# Patient Record
Sex: Female | Born: 1970 | Race: Black or African American | Hispanic: Refuse to answer | Marital: Married | Smoking: Never smoker
Health system: Southern US, Community
[De-identification: ages and names within clinical notes are randomized; demographics above are authoritative.]

## PROBLEM LIST (undated history)

## (undated) DIAGNOSIS — T7840XA Allergy, unspecified, initial encounter: Secondary | ICD-10-CM

## (undated) DIAGNOSIS — E559 Vitamin D deficiency, unspecified: Secondary | ICD-10-CM

## (undated) HISTORY — PX: BREAST REDUCTION SURGERY: SHX8

## (undated) HISTORY — DX: Vitamin D deficiency, unspecified: E55.9

## (undated) HISTORY — PX: WISDOM TOOTH EXTRACTION: SHX21

## (undated) HISTORY — DX: Allergy, unspecified, initial encounter: T78.40XA

## (undated) HISTORY — PX: REDUCTION MAMMAPLASTY: SUR839

---

## 1997-12-22 ENCOUNTER — Ambulatory Visit (HOSPITAL_COMMUNITY): Admission: RE | Admit: 1997-12-22 | Discharge: 1997-12-23 | Payer: Self-pay | Admitting: Specialist

## 2001-06-13 ENCOUNTER — Other Ambulatory Visit: Admission: RE | Admit: 2001-06-13 | Discharge: 2001-06-13 | Payer: Self-pay | Admitting: Gynecology

## 2001-08-06 ENCOUNTER — Emergency Department (HOSPITAL_COMMUNITY): Admission: EM | Admit: 2001-08-06 | Discharge: 2001-08-06 | Payer: Self-pay | Admitting: Emergency Medicine

## 2002-02-28 ENCOUNTER — Encounter: Payer: Self-pay | Admitting: Endocrinology

## 2002-02-28 ENCOUNTER — Encounter: Admission: RE | Admit: 2002-02-28 | Discharge: 2002-02-28 | Payer: Self-pay | Admitting: Endocrinology

## 2002-06-22 ENCOUNTER — Other Ambulatory Visit: Admission: RE | Admit: 2002-06-22 | Discharge: 2002-06-22 | Payer: Self-pay | Admitting: Gynecology

## 2002-09-26 ENCOUNTER — Encounter: Admission: RE | Admit: 2002-09-26 | Discharge: 2002-09-26 | Payer: Self-pay | Admitting: Endocrinology

## 2002-09-26 ENCOUNTER — Encounter: Payer: Self-pay | Admitting: Endocrinology

## 2003-07-03 ENCOUNTER — Other Ambulatory Visit: Admission: RE | Admit: 2003-07-03 | Discharge: 2003-07-03 | Payer: Self-pay | Admitting: Gynecology

## 2004-01-15 ENCOUNTER — Encounter: Admission: RE | Admit: 2004-01-15 | Discharge: 2004-01-15 | Payer: Self-pay | Admitting: Gynecology

## 2004-06-04 ENCOUNTER — Other Ambulatory Visit: Admission: RE | Admit: 2004-06-04 | Discharge: 2004-06-04 | Payer: Self-pay | Admitting: Gynecology

## 2005-05-07 ENCOUNTER — Other Ambulatory Visit: Admission: RE | Admit: 2005-05-07 | Discharge: 2005-05-07 | Payer: Self-pay | Admitting: Gynecology

## 2005-10-23 ENCOUNTER — Encounter: Admission: RE | Admit: 2005-10-23 | Discharge: 2005-10-23 | Payer: Self-pay | Admitting: Endocrinology

## 2006-07-20 ENCOUNTER — Other Ambulatory Visit: Admission: RE | Admit: 2006-07-20 | Discharge: 2006-07-20 | Payer: Self-pay | Admitting: Gynecology

## 2007-06-04 LAB — HM PAP SMEAR

## 2007-07-19 ENCOUNTER — Other Ambulatory Visit: Admission: RE | Admit: 2007-07-19 | Discharge: 2007-07-19 | Payer: Self-pay | Admitting: Gynecology

## 2009-07-17 ENCOUNTER — Ambulatory Visit: Payer: Self-pay | Admitting: Vascular Surgery

## 2010-01-22 ENCOUNTER — Ambulatory Visit: Payer: Self-pay | Admitting: Internal Medicine

## 2010-05-27 ENCOUNTER — Encounter (INDEPENDENT_AMBULATORY_CARE_PROVIDER_SITE_OTHER): Payer: Self-pay | Admitting: *Deleted

## 2010-05-27 LAB — CONVERTED CEMR LAB
ALT: 26 units/L (ref 0–35)
AST: 26 units/L (ref 0–37)
Albumin: 4 g/dL (ref 3.5–5.2)
Alkaline Phosphatase: 52 units/L (ref 39–117)
BUN: 9 mg/dL (ref 6–23)
Basophils Absolute: 0 10*3/uL (ref 0.0–0.1)
Basophils Relative: 0 % (ref 0–1)
CO2: 23 meq/L (ref 19–32)
Calcium: 9.2 mg/dL (ref 8.4–10.5)
Chlamydia, DNA Probe: NEGATIVE
Chloride: 105 meq/L (ref 96–112)
Cholesterol: 153 mg/dL (ref 0–200)
Creatinine, Ser: 0.84 mg/dL (ref 0.40–1.20)
Eosinophils Absolute: 0 10*3/uL (ref 0.0–0.7)
Eosinophils Relative: 0 % (ref 0–5)
GC Probe Amp, Genital: NEGATIVE
Glucose, Bld: 84 mg/dL (ref 70–99)
HCT: 38.7 % (ref 36.0–46.0)
HDL: 70 mg/dL (ref >39)
Hemoglobin: 12.5 g/dL (ref 12.0–15.0)
LDL Cholesterol: 67 mg/dL (ref 0–99)
Lymphocytes Relative: 39 % (ref 12–46)
Lymphs Abs: 1.8 10*3/uL (ref 0.7–4.0)
MCHC: 32.3 g/dL (ref 30.0–36.0)
MCV: 90.2 fL (ref 78.0–100.0)
Monocytes Absolute: 0.2 10*3/uL (ref 0.1–1.0)
Monocytes Relative: 4 % (ref 3–12)
Neutro Abs: 2.6 10*3/uL (ref 1.7–7.7)
Neutrophils Relative %: 56 % (ref 43–77)
Platelets: 372 10*3/uL (ref 150–400)
Potassium: 4.1 meq/L (ref 3.5–5.3)
RBC: 4.29 M/uL (ref 3.87–5.11)
RDW: 15.2 % (ref 11.5–15.5)
Sodium: 140 meq/L (ref 135–145)
TSH: 0.681 microintl units/mL (ref 0.350–4.500)
Total Bilirubin: 0.6 mg/dL (ref 0.3–1.2)
Total CHOL/HDL Ratio: 2.2
Total Protein: 7.8 g/dL (ref 6.0–8.3)
Triglycerides: 81 mg/dL (ref <150)
VLDL: 16 mg/dL (ref 0–40)
WBC: 4.6 10*3/uL (ref 4.0–10.5)

## 2010-12-16 NOTE — Procedures (Signed)
DUPLEX DEEP VENOUS EXAM - LOWER EXTREMITY   INDICATION:  Right calf pain.   HISTORY:  Edema:  Zero.  Trauma/Surgery:  Right foot surgery.  Pain:  Yes.  PE:  No.  Previous DVT:  No.  Anticoagulants:  No.  Other:  No.   DUPLEX EXAM:                CFV   SFV   PopV  PTV    GSV                R  L  R  L  R  L  R   L  R  L  Thrombosis    o  o  o     o     o      o  Spontaneous   +  +  +     +     +      +  Phasic        +  +  +     +     +      +  Augmentation  +  +  +     +     +      +  Compressible  +  +  +     +     +      +  Competent     +  +  +     +     +      +   Legend:  + - yes  o - no  p - partial  D - decreased   IMPRESSION:  There does not appear to be any deep venous thrombus noted  in the right leg.    _____________________________  Larina Earthly, M.D.   CB/MEDQ  D:  07/19/2009  T:  07/19/2009  Job:  [id number]

## 2011-08-04 NOTE — L&D Delivery Note (Signed)
Delivery Note At 6:45 PM a viable female was delivered via SVD (LOP ).  APGAR: 8, 9; weight .   Placenta status: complete and intact, .  Cord: 3vc  with the following complications: none .    Anesthesia:  none Episiotomy: none Lacerations: small labial laceration, hemostatic. No repair Suture Repair: none Est. Blood Loss (mL):   Mom to postpartum.  Baby to nursery-stable.  Tawnya Crook 06/08/2012, 7:04 PM

## 2011-11-09 ENCOUNTER — Other Ambulatory Visit: Payer: Self-pay

## 2011-11-09 LAB — OB RESULTS CONSOLE HGB/HCT, BLOOD: HCT: 33 %

## 2011-11-09 LAB — OB RESULTS CONSOLE RPR: RPR: NONREACTIVE

## 2011-11-09 LAB — OB RESULTS CONSOLE ABO/RH: RH Type: POSITIVE

## 2011-11-09 LAB — OB RESULTS CONSOLE HEPATITIS B SURFACE ANTIGEN: Hepatitis B Surface Ag: NEGATIVE

## 2011-11-09 LAB — OB RESULTS CONSOLE HIV ANTIBODY (ROUTINE TESTING): HIV: NONREACTIVE

## 2011-12-05 ENCOUNTER — Other Ambulatory Visit: Payer: Self-pay

## 2011-12-22 ENCOUNTER — Other Ambulatory Visit (HOSPITAL_COMMUNITY): Payer: Self-pay | Admitting: Obstetrics and Gynecology

## 2011-12-22 ENCOUNTER — Encounter (HOSPITAL_COMMUNITY): Payer: Self-pay | Admitting: Obstetrics and Gynecology

## 2011-12-22 DIAGNOSIS — Z3689 Encounter for other specified antenatal screening: Secondary | ICD-10-CM

## 2011-12-22 DIAGNOSIS — O289 Unspecified abnormal findings on antenatal screening of mother: Secondary | ICD-10-CM

## 2011-12-22 DIAGNOSIS — O09529 Supervision of elderly multigravida, unspecified trimester: Secondary | ICD-10-CM

## 2012-01-05 ENCOUNTER — Ambulatory Visit (HOSPITAL_COMMUNITY)
Admission: RE | Admit: 2012-01-05 | Discharge: 2012-01-05 | Disposition: A | Discharge status: Home / Self Care | Payer: BC Managed Care – PPO | Source: Ambulatory Visit | Attending: Obstetrics and Gynecology | Admitting: Obstetrics and Gynecology

## 2012-01-05 ENCOUNTER — Other Ambulatory Visit (HOSPITAL_COMMUNITY): Payer: Self-pay

## 2012-01-05 ENCOUNTER — Encounter (HOSPITAL_COMMUNITY): Payer: Self-pay

## 2012-01-05 VITALS — BP 131/79 | HR 85 | Wt 225.0 lb

## 2012-01-05 DIAGNOSIS — O289 Unspecified abnormal findings on antenatal screening of mother: Secondary | ICD-10-CM | POA: Insufficient documentation

## 2012-01-05 DIAGNOSIS — O09529 Supervision of elderly multigravida, unspecified trimester: Secondary | ICD-10-CM

## 2012-01-05 DIAGNOSIS — O358XX0 Maternal care for other (suspected) fetal abnormality and damage, not applicable or unspecified: Secondary | ICD-10-CM | POA: Insufficient documentation

## 2012-01-05 DIAGNOSIS — O09519 Supervision of elderly primigravida, unspecified trimester: Secondary | ICD-10-CM | POA: Insufficient documentation

## 2012-01-05 DIAGNOSIS — Z1389 Encounter for screening for other disorder: Secondary | ICD-10-CM | POA: Insufficient documentation

## 2012-01-05 DIAGNOSIS — Z3689 Encounter for other specified antenatal screening: Secondary | ICD-10-CM

## 2012-01-05 DIAGNOSIS — Z363 Encounter for antenatal screening for malformations: Secondary | ICD-10-CM | POA: Insufficient documentation

## 2012-01-05 NOTE — Progress Notes (Signed)
Patient seen today  for ultrasound appointment.  See full report in [facility].  Alpha Gula, MD  Patient seen today due to advanced maternal age and 1:61 Down syndrome risk by first trimester screen.  Ultrasound revealed 2 soft markers for Down syndrome (thickened nuchal fold, bilateral renal pylectasis). Reviewed risk of Down syndrome based on the presence of 2 soft-markers for aneuploidy.  After counseling, the patient elected to undergo cell free fetal DNA testing.  Single IUP at 17 weeks 3 days Thickened nuchal fold Bilateral mild renal pylectasis is appreciated (4 mm) The remainder of the fetal anatomy appears normal; however, limited views of the fetal heart were obtained due to fetal positioning and early gestational age Normal amniotic fluid volume  Recommend follow up ultrasound in 4 weeks to reevaluate the fetal heart.  If [facility] test (cell free fetal DNA) returns positive for Trisomy 21, would recommend formal fetal echo with Peds cardiology.

## 2012-01-07 ENCOUNTER — Encounter (HOSPITAL_COMMUNITY): Payer: Self-pay

## 2012-01-07 NOTE — Progress Notes (Signed)
Genetic Counseling  High-Risk Gestation Note  Appointment Date:  01/05/2012 Referred By: Shelby Pila, MD Date of Birth:  [birth year]    Pregnancy History: G1P0 Estimated Date of Delivery: 06/11/12 Estimated Gestational Age: [redacted]w[redacted]d Attending: Alpha Gula, MD   [name] and her husband, [name], were seen for genetic counseling regarding a maternal age of 41 y.o. and an screen positive risk for Down syndrome based on First Trimester Screening.  They were counseled regarding maternal age and the association with risk for chromosome conditions due to nondisjunction with aging of the ova.   We reviewed chromosomes, nondisjunction, and the associated 1 in 27 risk for fetal aneuploidy related to a maternal age of 41 y.o. at [redacted]w[redacted]d gestation.  They were counseled that the risk for aneuploidy decreases as gestational age increases, accounting for those pregnancies which spontaneously abort.  We specifically discussed Down syndrome (trisomy 38), trisomies 16 and 60, and sex chromosome aneuploidies (47,XXX and 47,XXY) including the common features and prognoses of each.   We also reviewed [name]'s First trimester screen result and the associated decrease in risk for fetal Down syndrome (1 in 54 to 1 in 14). However, since 1 in 52 is still above the screen's cutoff, this is considered a screen positive result. They were counseled regarding other explanations for a screen positive result including normal variation and differences in maternal metabolism.  In addition, we reviewed the screen adjusted reduction in risk for trisomy 18 (1 in 105 to less than 1 in 10,000).  They understand that First trimester screening provides a pregnancy specific risk for these conditions, but is not considered to be diagnostic.    We reviewed other available screening options including noninvasive prenatal testing (NIPT) and detailed ultrasound.  Specifically, we discussed that NIPT  analyzes cell free fetal DNA found in the maternal circulation. This test is not diagnostic for chromosome conditions, but can provide information regarding the presence or absence of extra fetal DNA for chromosomes 13, 18, and 21. Thus, it would not identify or rule out all genetic conditions. The reported detection rate is greater than 99% for Trisomy 21, greater than 98% for Trisomy 18, and is approximately 80% (8 out of 10) for Trisomy 13. The false positive rate is reported to be less than 0.1% for any of these conditions.  In addition, we discussed that ~50-80% of fetuses with Down syndrome and up to 90-95% of fetuses with trisomy 18/13, when well visualized, have detectable anomalies or soft markers by detailed ultrasound. This couple was also counseled regarding diagnostic testing via amniocentesis.  We reviewed the approximate 1 in 300-500 risk for complications, including spontaneous pregnancy loss.   A complete ultrasound was performed today.  Ultrasound performed today visualized a thickened nuchal fold measurement and bilateral renal pyelectasis.  Limited views of fetal heart were obtained due to fetal position and gestational age. The ultrasound report will be sent under separate cover. A follow-up ultrasound was planned in 4 weeks. The nuchal fold refers to the skin on the back of the fetal neck. A thick nuchal fold measurement is typically defined as greater than 5 mm at 15-[redacted] weeks gestation and greater than 6 mm at 18 through [redacted] weeks gestation. Approximately 1-2% of normal pregnancies will have a thick nuchal fold; therefore, most babies with this finding are born normal and healthy.  However, the presence of a thick nuchal fold increases the chance for Down syndrome in a pregnancy.  We discussed that fetal  pyelectasis is defined as the dilatation of the fetal renal pelvis/pelvises due to excess urine. This finding is estimated to occur in 2-3% of fetuses.  The female to female ratio is 2:1.   Typically, babies with mild pyelectasis are born normal and healthy and we are usually unable to determine why this extra fluid is present.  This urine accumulation may regress, stay the same or continue to accumulate.  The more fluid that accumulates, the more likely this fluid could be the result of a compromise in kidney function, an obstruction, or narrowing of the ureters which transport urine out of the body, thus causing backflow of fluid into the kidneys.  Therefore, it is important to follow pyelectasis to make sure it does not become more concerning.  Also, in some cases postnatal evaluation of baby's kidneys may be warranted.  We discussed that the finding of pyelectasis is associated with an increased risk for fetal aneuploidy.  This risk is highest when other anomalies or fetal differences are visualized.   We discussed that the presence of both a thickened nuchal fold and pyelectasis would increase the chance for Down syndrome above the patient's First trimester screen risk of 1 in 61 to approximately 16%. After consideration of all the additional screening and testing options, and a clear understanding of the newness and limitations of NIPT, they elected to proceed with cell free fetal DNA testing ([facility]) at the time of today's visit, but declined amniocentesis. Those results will be available in 8-10 days. We will contact the patient and forward results to her OB office once results are available.   [name] was provided with written information regarding sickle cell anemia (SCA) including the carrier frequency and incidence in the African-American population, the availability of carrier testing and prenatal diagnosis if indicated.  In addition, we discussed that hemoglobinopathies are routinely screened for as part of the [facility] newborn screening panel.  She previously had hemoglobin electrophoresis performed through her OB, which indicated the presence of normal adult hemoglobin (Hgb AA).     Both family histories were reviewed and found to be contributory for polycystic kidney disease. The patient reported that her mother has a history of polycystic kidney disease and had a kidney transplant in her 60's. Additionally, the patient's maternal uncle and maternal grandmother have a history of polycystic kidney disease. [name] reported that she had a normal kidney evaluation in her 34's.  She reported that none of her siblings have symptoms or a diagnosis of polycystic kidney disease.   Polycystic kidney disease (PKD) is one of the most common genetic disorders in adulthood with an incidence of about 1 in 1000. It is a systemic disease that is associated with renal system failure, liver disease, cerebral aneurysm, portal hypertension, dissection of the aorta and other cardiac abnormalities. There are two forms of PKD: autosomal dominant PKD (ADPKD) and autosomal recessive PKD (ARPKD).  ADPKD is generally a late-onset disorder characterized by progressive cyst development and bilaterally enlarged polycystic kidneys. These cysts can slowly replace much of the mass of the kidneys, reducing kidney function and leading to kidney failure. The condition is clinically variable, even within families.  Rarely does this condition present prenatally or in infancy.  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 We reviewed autosomal dominant inheritance. A person who has a dominant genetic condition tends to show symptoms of the disease, and each of their pregnancies has a 1 in 2 (50%)  chance to inherit the condition.  About 85% of cases result from an altered gene located on chromosome 16 (PKD1).  The remaining 15% of cases are thought to result from an altered gene on chromosome 4 (PKD2).  There may also be other genes involved in some cases.  Genetic testing is available and is most informative by testing a symptomatic family member first. It is  important to realize that genetic testing is not useful in predicting age of onset, severity, or rate of disease progression in individuals. The diagnosis of autosomal dominant polycystic kidney disease is established primarily by renal imaging studies and genetic testing can be used to confirm or establish an uncertain diagnosis.  Diagnostic criteria has been established for renal sonography and it is thought that almost all (nearly 100%) of patients with the more common type of autosomal dominant PKD (PKD1) will meet this criteria by the age of 30 years or older.  At risk family members may consider renal imaging studies for their own diagnostic purposes. Since [name] reported that her renal evaluation was within normal limits, the chance that she is a carrier of the disease mutation is greatly reduced.  If she does not carry the gene for the condition, it cannot be passed on to her offspring. It may be helpful for the couple make their pediatrician aware of this family history.    The father of the pregnancy reported a kidney transplant in 2009 due to focal segmental glomerulosclerosis (FSGS). He reported that the FSGS was suspected to be secondary to a viral infection. In the case of an environmental cause, this reported family history would not be expected to increase recurrence risk for relatives.  Without further information regarding the provided family history, an accurate genetic risk cannot be calculated. Further genetic counseling is warranted if more information is obtained.   [name] denied exposure to environmental toxins or chemical agents. She denied the use of alcohol, tobacco or street drugs. She denied significant viral illnesses during the course of her pregnancy. Her medical and surgical histories were noncontributory.     I counseled this couple regarding the above risks and available options.  The approximate face-to-face time with the genetic counselor was 45  minutes.    Quinn Plowman, MS,  Certified Genetic Counselor 01/07/2012

## 2012-01-15 ENCOUNTER — Telehealth (HOSPITAL_COMMUNITY): Payer: Self-pay | Admitting: MS"

## 2012-01-15 NOTE — Telephone Encounter (Signed)
Left message for patient to return call.   Shelby Duran 01/15/2012 2:33 PM

## 2012-01-15 NOTE — Telephone Encounter (Signed)
Called Shelby Duran to discuss her [facility], cell free fetal DNA testing.  We reviewed that these are within normal limits, showing a less than 1 in 10,000 risk for trisomies 21, 18 and 13.  We reviewed that this testing identifies > 99% of pregnancies with trisomy 21, >97% of pregnancies with trisomy 38, and >80% with trisomy 23; the false positive rate is <0.1% for all conditions.  She understands that this testing does not identify all genetic conditions.  All questions were answered to her satisfaction, she was encouraged to call with additional questions or concerns.  Quinn Plowman, MS Patent attorney

## 2012-02-02 ENCOUNTER — Ambulatory Visit (HOSPITAL_COMMUNITY): Payer: BC Managed Care – PPO

## 2012-02-25 DIAGNOSIS — IMO0002 Reserved for concepts with insufficient information to code with codable children: Secondary | ICD-10-CM

## 2012-02-25 DIAGNOSIS — Z3400 Encounter for supervision of normal first pregnancy, unspecified trimester: Secondary | ICD-10-CM

## 2012-03-09 ENCOUNTER — Ambulatory Visit (INDEPENDENT_AMBULATORY_CARE_PROVIDER_SITE_OTHER): Payer: BC Managed Care – PPO | Admitting: Advanced Practice Midwife

## 2012-03-09 ENCOUNTER — Encounter: Payer: Self-pay | Admitting: Advanced Practice Midwife

## 2012-03-09 VITALS — BP 117/82 | Temp 98.1°F | Ht 67.5 in | Wt 226.1 lb

## 2012-03-09 DIAGNOSIS — Z531 Procedure and treatment not carried out because of patient's decision for reasons of belief and group pressure: Secondary | ICD-10-CM | POA: Insufficient documentation

## 2012-03-09 DIAGNOSIS — O09529 Supervision of elderly multigravida, unspecified trimester: Secondary | ICD-10-CM

## 2012-03-09 DIAGNOSIS — Z3400 Encounter for supervision of normal first pregnancy, unspecified trimester: Secondary | ICD-10-CM

## 2012-03-09 DIAGNOSIS — IMO0002 Reserved for concepts with insufficient information to code with codable children: Secondary | ICD-10-CM

## 2012-03-09 LAB — CBC
HCT: 33.2 % — ABNORMAL LOW (ref 36.0–46.0)
Hemoglobin: 10.8 g/dL — ABNORMAL LOW (ref 12.0–15.0)
MCV: 87.8 fL (ref 78.0–100.0)
RBC: 3.78 MIL/uL — ABNORMAL LOW (ref 3.87–5.11)
WBC: 5.5 10*3/uL (ref 4.0–10.5)

## 2012-03-09 LAB — POCT URINALYSIS DIP (DEVICE)
Bilirubin Urine: NEGATIVE
Hgb urine dipstick: NEGATIVE
Ketones, ur: NEGATIVE mg/dL
Leukocytes, UA: NEGATIVE
Protein, ur: NEGATIVE mg/dL
Specific Gravity, Urine: >=1.03 (ref 1.005–1.030)
pH: 6 (ref 5.0–8.0)

## 2012-03-09 NOTE — Patient Instructions (Addendum)
Pregnancy - Third Trimester The third trimester of pregnancy (the last 3 months) is a period of the most rapid growth for you and your baby. The baby approaches a length of 20 inches and a weight of 6 to 10 pounds. The baby is adding on fat and getting ready for life outside your body. While inside, babies have periods of sleeping and waking, suck their thumbs, and hiccups. You can often feel small contractions of the uterus. This is false labor. It is also called Braxton-Hicks contractions. This is like a practice for labor. The usual problems in this stage of pregnancy include more difficulty breathing, swelling of the hands and feet from water retention, and having to urinate more often because of the uterus and baby pressing on your bladder.  PRENATAL EXAMS  Blood work may continue to be done during prenatal exams. These tests are done to check on your health and the probable health of your baby. Blood work is used to follow your blood levels (hemoglobin). Anemia (low hemoglobin) is common during pregnancy. Iron and vitamins are given to help prevent this. You may also continue to be checked for diabetes. Some of the past blood tests may be done again.   The size of the uterus is measured during each visit. This makes sure your baby is growing properly according to your pregnancy dates.   Your blood pressure is checked every prenatal visit. This is to make sure you are not getting toxemia.   Your urine is checked every prenatal visit for infection, diabetes and protein.   Your weight is checked at each visit. This is done to make sure gains are happening at the suggested rate and that you and your baby are growing normally.   Sometimes, an ultrasound is performed to confirm the position and the proper growth and development of the baby. This is a test done that bounces harmless sound waves off the baby so your caregiver can more accurately determine due dates.   Discuss the type of pain  medication and anesthesia you will have during your labor and delivery.   Discuss the possibility and anesthesia if a Cesarean Section might be necessary.   Inform your caregiver if there is any mental or physical violence at home.  Sometimes, a specialized non-stress test, contraction stress test and biophysical profile are done to make sure the baby is not having a problem. Checking the amniotic fluid surrounding the baby is called an amniocentesis. The amniotic fluid is removed by sticking a needle into the belly (abdomen). This is sometimes done near the end of pregnancy if an early delivery is required. In this case, it is done to help make sure the baby's lungs are mature enough for the baby to live outside of the womb. If the lungs are not mature and it is unsafe to deliver the baby, an injection of cortisone medication is given to the mother 1 to 2 days before the delivery. This helps the baby's lungs mature and makes it safer to deliver the baby. CHANGES OCCURING IN THE THIRD TRIMESTER OF PREGNANCY Your body goes through many changes during pregnancy. They vary from person to person. Talk to your caregiver about changes you notice and are concerned about.  During the last trimester, you have probably had an increase in your appetite. It is normal to have cravings for certain foods. This varies from person to person and pregnancy to pregnancy.   You may begin to get stretch marks on your hips,   abdomen, and breasts. These are normal changes in the body during pregnancy. There are no exercises or medications to take which prevent this change.   Constipation may be treated with a stool softener or adding bulk to your diet. Drinking lots of fluids, fiber in vegetables, fruits, and whole grains are helpful.   Exercising is also helpful. If you have been very active up until your pregnancy, most of these activities can be continued during your pregnancy. If you have been less active, it is helpful  to start an exercise program such as walking. Consult your caregiver before starting exercise programs.   Avoid all smoking, alcohol, un-prescribed drugs, herbs and "street drugs" during your pregnancy. These chemicals affect the formation and growth of the baby. Avoid chemicals throughout the pregnancy to ensure the delivery of a healthy infant.   Backache, varicose veins and hemorrhoids may develop or get worse.   You will tire more easily in the third trimester, which is normal.   The baby's movements may be stronger and more often.   You may become short of breath easily.   Your belly button may stick out.   A yellow discharge may leak from your breasts called colostrum.   You may have a bloody mucus discharge. This usually occurs a few days to a week before labor begins.  HOME CARE INSTRUCTIONS   Keep your caregiver's appointments. Follow your caregiver's instructions regarding medication use, exercise, and diet.   During pregnancy, you are providing food for you and your baby. Continue to eat regular, well-balanced meals. Choose foods such as meat, fish, milk and other low fat dairy products, vegetables, fruits, and whole-grain breads and cereals. Your caregiver will tell you of the ideal weight gain.   A physical sexual relationship may be continued throughout pregnancy if there are no other problems such as early (premature) leaking of amniotic fluid from the membranes, vaginal bleeding, or belly (abdominal) pain.   Exercise regularly if there are no restrictions. Check with your caregiver if you are unsure of the safety of your exercises. Greater weight gain will occur in the last 2 trimesters of pregnancy. Exercising helps:   Control your weight.   Get you in shape for labor and delivery.   You lose weight after you deliver.   Rest a lot with legs elevated, or as needed for leg cramps or low back pain.   Wear a good support or jogging bra for breast tenderness during  pregnancy. This may help if worn during sleep. Pads or tissues may be used in the bra if you are leaking colostrum.   Do not use hot tubs, steam rooms, or saunas.   Wear your seat belt when driving. This protects you and your baby if you are in an accident.   Avoid raw meat, cat litter boxes and soil used by cats. These carry germs that can cause birth defects in the baby.   It is easier to loose urine during pregnancy. Tightening up and strengthening the pelvic muscles will help with this problem. You can practice stopping your urination while you are going to the bathroom. These are the same muscles you need to strengthen. It is also the muscles you would use if you were trying to stop from passing gas. You can practice tightening these muscles up 10 times a set and repeating this about 3 times per day. Once you know what muscles to tighten up, do not perform these exercises during urination. It is more likely   to cause an infection by backing up the urine.   Ask for help if you have financial, counseling or nutritional needs during pregnancy. Your caregiver will be able to offer counseling for these needs as well as refer you for other special needs.   Make a list of emergency phone numbers and have them available.   Plan on getting help from family or friends when you go home from the hospital.   Make a trial run to the hospital.   Take prenatal classes with the father to understand, practice and ask questions about the labor and delivery.   Prepare the baby's room/nursery.   Do not travel out of the city unless it is absolutely necessary and with the advice of your caregiver.   Wear only low or no heal shoes to have better balance and prevent falling.  MEDICATIONS AND DRUG USE IN PREGNANCY  Take prenatal vitamins as directed. The vitamin should contain 1 milligram of folic acid. Keep all vitamins out of reach of children. Only a couple vitamins or tablets containing iron may be fatal  to a baby or young child when ingested.   Avoid use of all medications, including herbs, over-the-counter medications, not prescribed or suggested by your caregiver. Only take over-the-counter or prescription medicines for pain, discomfort, or fever as directed by your caregiver. Do not use aspirin, ibuprofen (Motrin, Advil, Nuprin) or naproxen (Aleve) unless OK'd by your caregiver.   Let your caregiver also know about herbs you may be using.   Alcohol is related to a number of birth defects. This includes fetal alcohol syndrome. All alcohol, in any form, should be avoided completely. Smoking will cause low birth rate and premature babies.   Street/illegal drugs are very harmful to the baby. They are absolutely forbidden. A baby born to an addicted mother will be addicted at birth. The baby will go through the same withdrawal an adult does.  SEEK MEDICAL CARE IF: You have any concerns or worries during your pregnancy. It is better to call with your questions if you feel they cannot wait, rather than worry about them. DECISIONS ABOUT CIRCUMCISION You may or may not know the sex of your baby. If you know your baby is a boy, it may be time to think about circumcision. Circumcision is the removal of the foreskin of the penis. This is the skin that covers the sensitive end of the penis. There is no proven medical need for this. Often this decision is made on what is popular at the time or based upon religious beliefs and social issues. You can discuss these issues with your caregiver or pediatrician. SEEK IMMEDIATE MEDICAL CARE IF:   An unexplained oral temperature above 102 F (38.9 C) develops, or as your caregiver suggests.   You have leaking of fluid from the vagina (birth canal). If leaking membranes are suspected, take your temperature and tell your caregiver of this when you call.   There is vaginal spotting, bleeding or passing clots. Tell your caregiver of the amount and how many pads are  used.   You develop a bad smelling vaginal discharge with a change in the color from clear to white.   You develop vomiting that lasts more than 24 hours.   You develop chills or fever.   You develop shortness of breath.   You develop burning on urination.   You loose more than 2 pounds of weight or gain more than 2 pounds of weight or as suggested by your   caregiver.   You notice sudden swelling of your face, hands, and feet or legs.   You develop belly (abdominal) pain. Round ligament discomfort is a common non-cancerous (benign) cause of abdominal pain in pregnancy. Your caregiver still must evaluate you.   You develop a severe headache that does not go away.   You develop visual problems, blurred or double vision.   If you have not felt your baby move for more than 1 hour. If you think the baby is not moving as much as usual, eat something with sugar in it and lie down on your left side for an hour. The baby should move at least 4 to 5 times per hour. Call right away if your baby moves less than that.   You fall, are in a car accident or any kind of trauma.   There is mental or physical violence at home.  Document Released: 07/14/2001 Document Revised: 07/09/2011 Document Reviewed: 01/16/2009 [employer] Patient Information 2012 [employer]. 

## 2012-03-09 NOTE — Progress Notes (Signed)
Pulse 90 Needs 1 hr gtt @ 953

## 2012-03-09 NOTE — Progress Notes (Signed)
New OB Transfer due to [facility] Witness status of blood product refusal.  Had [facility] with Dr Ellyn Hack. Reviewed chart.  Primary problems are AMA, fetal pyelectasis (mild), and abnormal genetic screen with [facility].  Discussed implications of blood product refusal (will copy her power of attorney info) in light of low risk of hemorrhage. She is aware that emergencies could happen which would present a potentially lethal situation if blood products refused and what would result from that refusal. She and husband are willing to accept risks. However, she currently does not have a high risk of hemorrhage.   Discussed low wt gain (she states she has only gained one pound). Will check growth on her followup US in early Sept at which time we will also check fetal kidneys. There is a family history of kidney problems. Wants to meet "all the doctors".  Explained how our service works. She would prefer "no learners". Also explained how we use residents. Will explore with Dr Penne Lash viability of having her be a CNM patient. She does want an unmedicated birth if possible, declines water birth.

## 2012-03-10 LAB — RPR

## 2012-03-23 ENCOUNTER — Ambulatory Visit (INDEPENDENT_AMBULATORY_CARE_PROVIDER_SITE_OTHER): Payer: BC Managed Care – PPO | Admitting: Physician Assistant

## 2012-03-23 VITALS — BP 120/78 | Temp 98.5°F | Wt 224.8 lb

## 2012-03-23 DIAGNOSIS — Z3400 Encounter for supervision of normal first pregnancy, unspecified trimester: Secondary | ICD-10-CM

## 2012-03-23 DIAGNOSIS — Z23 Encounter for immunization: Secondary | ICD-10-CM

## 2012-03-23 DIAGNOSIS — O09529 Supervision of elderly multigravida, unspecified trimester: Secondary | ICD-10-CM

## 2012-03-23 LAB — POCT URINALYSIS DIP (DEVICE)
Bilirubin Urine: NEGATIVE
Hgb urine dipstick: NEGATIVE
Ketones, ur: NEGATIVE mg/dL
Leukocytes, UA: NEGATIVE
Protein, ur: NEGATIVE mg/dL
Specific Gravity, Urine: 1.025 (ref 1.005–1.030)
pH: 6 (ref 5.0–8.0)

## 2012-03-23 MED ORDER — TETANUS-DIPHTH-ACELL PERTUSSIS 5-2.5-18.5 LF-MCG/0.5 IM SUSP
0.5000 mL | Freq: Once | INTRAMUSCULAR | Status: AC
  Administered 2012-03-23: 0.5 mL via INTRAMUSCULAR

## 2012-03-23 NOTE — Progress Notes (Signed)
Pt doing well overall.  Denies contractions, bleeding, discharge, dysuria, LBP.  Is having increased fetal movement (>10 per 2 hours).  1 hr GTT was 105.

## 2012-03-23 NOTE — Progress Notes (Signed)
I have seen/examine this patient and agree with the above. Literature given on Epo and Cell saver for pt to review and discuss at next visit. Will re-chk Hgb at 32 weeks.

## 2012-03-23 NOTE — Progress Notes (Signed)
Pulse 86 Patient desires tdap today

## 2012-03-23 NOTE — Patient Instructions (Addendum)
Fetal Movement Counts Patient Name: __________________________________________________ Patient Due Date: ____________________ Shelby Duran counts is highly recommended in high risk pregnancies, but it is a good idea for every pregnant woman to do. Start counting fetal movements at 28 weeks of the pregnancy. Fetal movements increase after eating a full meal or eating or drinking something sweet (the blood sugar is higher). It is also important to drink plenty of fluids (well hydrated) before doing the count. Lie on your left side because it helps with the circulation or you can sit in a comfortable chair with your arms over your belly (abdomen) with no distractions around you. DOING THE COUNT  Try to do the count the same time of day each time you do it.   Mark the day and time, then see how long it takes for you to feel 10 movements (kicks, flutters, swishes, rolls). You should have at least 10 movements within 2 hours. You will most likely feel 10 movements in much less than 2 hours. If you do not, wait an hour and count again. After a couple of days you will see a pattern.   What you are looking for is a change in the pattern or not enough counts in 2 hours. Is it taking longer in time to reach 10 movements?  SEEK MEDICAL CARE IF:  You feel less than 10 counts in 2 hours. Tried twice.   No movement in one hour.   The pattern is changing or taking longer each day to reach 10 counts in 2 hours.   You feel the baby is not moving as it usually does.  Date: ____________ Movements: ____________ Start time: ____________ Shelby Duran time: ____________  Date: ____________ Movements: ____________ Start time: ____________ Shelby Duran time: ____________ Date: ____________ Movements: ____________ Start time: ____________ Shelby Duran time: ____________ Date: ____________ Movements: ____________ Start time: ____________ Shelby Duran time: ____________ Date: ____________ Movements: ____________ Start time: ____________ Shelby Duran time:  ____________  Epoetin Alfa injection What is this medicine? EPOETIN ALFA (e POE e tin AL fa) helps your body make more red blood cells. This medicine is used to treat anemia caused by chronic kidney failure, cancer chemotherapy, or HIV-therapy. It may also be used before surgery if you have anemia. This medicine may be used for other purposes; ask your health care provider or pharmacist if you have questions. What should I tell my health care provider before I take this medicine? They need to know if you have any of these conditions: -blood clotting disorders -cancer patient not on chemotherapy -cystic fibrosis -heart disease, such as angina or heart failure -hemoglobin level of 12 g/dL or greater -high blood pressure -low levels of folate, iron, or vitamin B12 -seizures -an unusual or allergic reaction to erythropoietin, albumin, benzyl alcohol, hamster proteins, other medicines, foods, dyes, or preservatives -pregnant or trying to get pregnant -breast-feeding  Intraoperative and postoperative blood salvage   All topics are updated as new evidence becomes available and our peer review process is complete.  Literature review current through: Jul 2013.  This topic last updated: Aug 05, 2011.  INTRODUCTION - After preoperative autologous donation and intraoperative hemodilution, intraoperative and postoperative blood salvage are the third and fourth components of a complete blood conservation program. (See "Preoperative autologous blood donation" and "Acute normovolemic intraoperative hemodilution".) In salvage techniques, blood that is shed during or after surgery (or trauma) is retrieved, processed in some fashion, and returned to the patient. Processing can be as simple as filtration or, most commonly, involves centrifugation and washing prior  to re-transfusion. INTRAOPERATIVE BLOOD SALVAGE - Intraoperative blood salvage (IBS) is also known as intraoperative autologous transfusion,  intraoperative salvage, or intraoperative autotransfusion. IBS is unique among autologous transfusion methods because of its capacity to provide immense quantities of autologous blood very rapidly. In comparison, preoperative collection is limited by time constraints and patient tolerance, hemodilution by blood volume and hemodynamic considerations, and postoperative salvage by mechanical problems and concern about microbial contamination. IBS can be utilized throughout a surgical procedure and can replace blood in proportion to the volume lost. In certain situations, most notably liver transplantation, the rate and volume of replacement may be extraordinary [1]. Technology - The modern era of IBS technology began in the mid-1970s when the two currently available salvage systems - a centrifuge-based cell salvage instrument and a passive canister collection system (used with or without supplementary washing) - were introduced. The first commercially available instrument that could concentrate and wash salvaged red cells was the Cell Saver ([employer], [place], [place]), introduced in 1974. Cell Saver technology has become so widely accepted (through five generations of Cell Savers) that virtually all similar IBS instruments are referred to as "cell savers". The procedure starts with the surgeon aspirating blood from the surgical field through a suction wand attached to dual-channel tubing; this allows anticoagulant and blood to be mixed as the blood is aspirated. The aspirated blood is collected in a reservoir (usually a cardiotomy reservoir), until there is sufficient blood for processing. At this point, the salvaged blood is pumped into the centrifuge bowl, where it is concentrated and then washed with an isotonic electrolyte solution, most often saline. The processed red cell suspension is then pumped from the centrifuge bowl into an infusion bag. Modern cell salvage instruments can process a full reservoir of  blood, and provide 225 mL of washed, saline-suspended red cells with a hematocrit of 50 percent or more in approximately three minutes. Thus, a massively bleeding patient can be provided with the equivalent of 12 units of banked blood per hour. Canister collection systems began with one marketed by the [employer] [facility] [place], [place]), now marketed as Receptal ([employer], [employer], [place]). These systems share with the centrifuge systems the dual-channel suction tubing with which shed blood is aspirated and anticoagulated. Salvaged blood is collected by vacuum into a disposable liner bag contained in a reusable, rigid plastic canister. Blood that is collected in the liner bag can be concentrated and washed with standard blood bank washing instruments or it can be transfused directly (through a filter). Canister-based systems are much less expensive to purchase and to operate, as cell saver-type instruments retail for over $30,000 and require trained personnel to operate them. They are, however, unable to provide processed (ie, washed) blood for reinfusion fast enough to be effective in massively bleeding patients, and there remains substantial question about the quality and possibly the safety of unprocessed blood harvested from certain surgical procedures (see below) [2]. Indications - IBS is widely applied in a variety of high blood use surgical procedures, including cardiac [3,4], vascular [5], orthopedic [6], urologic [7,8], trauma [9,10], gynecologic [11] and transplantation surgery [1]. It has also been effective in neurosurgery and plastic surgery, when blood use was anticipated to be high. It has three other advantages: IBS, when used as the principal form of blood replacement, avoids the substantial (up to 50 percent) wastage of blood collected and not transfused via preoperative autologous donation.  IBS, in contradistinction to preoperative autologous donation, is considered  acceptable by  some Jehovah's witnesses, provided the blood does not leave the operating room for processing [12]. (See "The approach to the patient who refuses blood transfusion".)  IBS may be valuable for the patient with serologic problems for whom crossmatch-compatible blood is unobtainable. Contraindications - There are several contraindications to the use of IBS. Some are absolute, such as settings in which there is a potential for the simultaneous collection during salvage of microfibrillar collagen hemostats used to promote local hemostasis (eg, Avitene, [employer], [place], [place] hydrogen peroxide, or betadine. Other contraindications are relative, as with a patient with a known blood-borne infectious disease, which could endanger health care workers should the IBS apparatus malfunction [13]. Some previously absolute contraindications are undergoing continuous reevaluation, such as patients with malignancies and/or bacterial contamination of operative sites. Malignant cells have been seen in shed blood collected intraoperatively, and extensive cell washing does not render contaminated blood sterile. As a result, both of these settings have long been considered absolute contraindications to IBS. However, the pioneering use of IBS in cancer surgery [7,14,15] and evidence that the benefit of IBS in hemorrhaging trauma patients with enterically contaminated wounds outweighed the potential spread of the infection [16] lend support to those who feel that additional studies might lead to more liberal use of IBS in these conditions. Product quality - Salvaged, washed red cells differ little from traditional washed red cells. The quality of the red cells, as measured by posttransfusion survival, is excellent. The transfusate is devoid of functional platelets and white cells, and is markedly reduced in coagulation factors and other serum proteins. Unprocessed (unwashed) salvaged blood differs substantially  from the washed product: hemoglobin levels are lower (range: 7.0 to 9.0 g/dL); plasma hemoglobin levels are often strikingly high; platelets are present, although their function may be impaired; anticoagulant is present in measurable amounts; and, most important, thromboplastic substances and other coagulation system components that might lead to coagulation abnormalities are present [2]. The latter characteristic has led to an unwavering belief, not entirely substantiated by clinical data, that unprocessed salvaged blood is inherently inferior to (or potentially more dangerous than) processed blood. The author's own view, after reviewing the literature, is that unprocessed blood taken from a systemically anticoagulated patient does not carry a substantially increased risk of inducing a coagulopathy. However, unprocessed blood taken from the surgical abdomen of a nonanticoagulated patient appears to increase the potential for inducing a coagulopathy when infused, and its use should be avoided, when possible. Complications - There are a number of potential and generally avoidable complications of IBS: Air embolism, which can be prevented by draining the reinfusion bag into a separate transfer pack prior to transfusion or, with newer instruments, by in-line air detectors.  A coagulopathy, which can be avoided by washing the salvaged blood.  The "salvaged blood syndrome" refers to the development of disseminated intravascular coagulation (DIC) and/or increased capillary permeability in the lungs (acute respiratory distress syndrome) or periphery (anasarca) after the administration of washed autologous red cells [17]. This syndrome appears to be mediated by activation of platelets and white cells during salvage. Platelet debris may be responsible for DIC whereas activated white cells may increase vascular permeability. This rare syndrome can be prevented by avoiding the aspiration of very dilute blood and by using  citrate, rather than heparin, as the anticoagulant.  Infection, which can be avoided by using prophylactic antibiotics and by not aspirating from obviously infected sites.  Fat embolism, which is preventable by extra washing and by using a microaggregate  filter for reinfusion.  Microaggregates, consisting of white cell and platelet debris, can develop in salvaged blood. Microembolization can be prevented by using a microaggregate filter for reinfusion. Standards - Hospitals with IBS programs or blood center providers of the service, should establish written policies and procedures that are regularly reviewed by a physician with responsibility for the program [18]. There must be written policy for the proper collection, labeling and storage of blood collected by IBS. Collection equipment and technique must ensure that the blood is aseptic. Periodic quality control testing of recovered blood is recommended. If collected under aseptic conditions with a device that washes with normal saline and if properly labeled, blood collected by IBS may be stored either at room temperature for up to six hours, or at 1 to 6 C for up to 24 hours, provided that cold storage is begun within six hours of initiating the collection. Proper labeling should include at a minimum the name and identifying number of the patient, the component name, the statement "For Autologous Use Only", and the expiration date and time. Blood collected by IBS should never be transfused to any patient other than the one from whom it was collected. Overview - IBS is an important method of blood conservation that complements other methods. It assumes increasing importance in the patient who is unable to donate autologous blood prior to surgery but who is anticipated to bleed substantially intraoperatively. Given the high cost of current technology, it becomes cost effective (when compared with the cost of providing allogeneic blood) when at least two or  more units of blood can be salvaged and reinfused. It is likely that the future will bring enhancements to the current technology, resulting in even greater cost effectiveness, thereby enhancing interest in IBS for a wider variety of surgical procedures. Additional studies in oncologic surgery, in particular, might pave the way for this technique to become a standard of practice in this vast group of patients who are not currently offered this autologous option. POSTOPERATIVE BLOOD SALVAGE - Postoperative blood salvage (PBS), or postoperative blood collection, refers to the collection of blood from surgical drains and reinfusion with or, much more commonly, without processing. PBS is an accepted practice in cardiac surgery [19], in which its safety and efficacy have been confirmed in most but not all studies [20,21]. It has also become increasing popular in orthopedic procedures [22]. The contribution of PBS to overall blood conservation is generally less than that of preoperative blood donation or IBS. Furthermore, some have questioned the ability of PBS to reduce allogeneic transfusion, particularly in orthopedic surgery [23].

## 2012-04-06 ENCOUNTER — Ambulatory Visit (HOSPITAL_COMMUNITY)
Admission: RE | Admit: 2012-04-06 | Discharge: 2012-04-06 | Disposition: A | Discharge status: Home / Self Care | Payer: BC Managed Care – PPO | Source: Ambulatory Visit | Attending: Advanced Practice Midwife | Admitting: Advanced Practice Midwife

## 2012-04-06 ENCOUNTER — Ambulatory Visit (INDEPENDENT_AMBULATORY_CARE_PROVIDER_SITE_OTHER): Payer: BC Managed Care – PPO | Admitting: Advanced Practice Midwife

## 2012-04-06 VITALS — BP 119/82 | Temp 97.0°F | Wt 229.0 lb

## 2012-04-06 DIAGNOSIS — O289 Unspecified abnormal findings on antenatal screening of mother: Secondary | ICD-10-CM | POA: Insufficient documentation

## 2012-04-06 DIAGNOSIS — O09529 Supervision of elderly multigravida, unspecified trimester: Secondary | ICD-10-CM

## 2012-04-06 DIAGNOSIS — IMO0002 Reserved for concepts with insufficient information to code with codable children: Secondary | ICD-10-CM

## 2012-04-06 DIAGNOSIS — Z3400 Encounter for supervision of normal first pregnancy, unspecified trimester: Secondary | ICD-10-CM

## 2012-04-06 DIAGNOSIS — O358XX0 Maternal care for other (suspected) fetal abnormality and damage, not applicable or unspecified: Secondary | ICD-10-CM | POA: Insufficient documentation

## 2012-04-06 DIAGNOSIS — O09519 Supervision of elderly primigravida, unspecified trimester: Secondary | ICD-10-CM | POA: Insufficient documentation

## 2012-04-06 LAB — POCT URINALYSIS DIP (DEVICE)
Hgb urine dipstick: NEGATIVE
Ketones, ur: NEGATIVE mg/dL
Protein, ur: NEGATIVE mg/dL
Specific Gravity, Urine: 1.01 (ref 1.005–1.030)

## 2012-04-06 NOTE — Progress Notes (Signed)
Pulse: 71

## 2012-04-06 NOTE — Progress Notes (Signed)
Pt doing well, reports good fetal movement, denies cramping/contractions, vaginal bleeding.  Reviewed today's u/s results with pt.  Anticipatory guidance given about third trimester of pregnancy.  Questions answered.

## 2012-04-20 ENCOUNTER — Ambulatory Visit (INDEPENDENT_AMBULATORY_CARE_PROVIDER_SITE_OTHER): Payer: BC Managed Care – PPO | Admitting: Family Medicine

## 2012-04-20 VITALS — BP 128/92 | Temp 97.2°F | Wt 231.1 lb

## 2012-04-20 DIAGNOSIS — IMO0002 Reserved for concepts with insufficient information to code with codable children: Secondary | ICD-10-CM

## 2012-04-20 DIAGNOSIS — Z3400 Encounter for supervision of normal first pregnancy, unspecified trimester: Secondary | ICD-10-CM

## 2012-04-20 DIAGNOSIS — O09529 Supervision of elderly multigravida, unspecified trimester: Secondary | ICD-10-CM

## 2012-04-20 LAB — POCT URINALYSIS DIP (DEVICE)
Bilirubin Urine: NEGATIVE
Glucose, UA: NEGATIVE mg/dL
Hgb urine dipstick: NEGATIVE
Specific Gravity, Urine: 1.025 (ref 1.005–1.030)
Urobilinogen, UA: 1 mg/dL (ref 0.0–1.0)
pH: 6.5 (ref 5.0–8.0)

## 2012-04-20 NOTE — Progress Notes (Signed)
Blood pressure slightly elevated today. No headache, vision changes or RUQ pain. Trace protein in UA. Watch BP next visit.

## 2012-04-20 NOTE — Patient Instructions (Signed)

## 2012-04-20 NOTE — Progress Notes (Signed)
No concerns. Had breast reduction surgery and would like to see lactation specialist prior to delivery. Given number to lactation clinic. Also given list of area pediatricians.

## 2012-04-20 NOTE — Progress Notes (Signed)
Pulse: 82

## 2012-05-04 ENCOUNTER — Ambulatory Visit (HOSPITAL_COMMUNITY): Payer: BC Managed Care – PPO

## 2012-05-04 ENCOUNTER — Encounter: Payer: BC Managed Care – PPO | Admitting: Obstetrics and Gynecology

## 2012-05-04 ENCOUNTER — Ambulatory Visit (INDEPENDENT_AMBULATORY_CARE_PROVIDER_SITE_OTHER): Payer: BC Managed Care – PPO | Admitting: Family Medicine

## 2012-05-04 VITALS — BP 126/90 | Temp 98.6°F | Wt 232.6 lb

## 2012-05-04 DIAGNOSIS — O09529 Supervision of elderly multigravida, unspecified trimester: Secondary | ICD-10-CM

## 2012-05-04 DIAGNOSIS — Z23 Encounter for immunization: Secondary | ICD-10-CM

## 2012-05-04 DIAGNOSIS — IMO0002 Reserved for concepts with insufficient information to code with codable children: Secondary | ICD-10-CM

## 2012-05-04 LAB — POCT URINALYSIS DIP (DEVICE)
Bilirubin Urine: NEGATIVE
Hgb urine dipstick: NEGATIVE
Ketones, ur: NEGATIVE mg/dL
Protein, ur: NEGATIVE mg/dL
pH: 6 (ref 5.0–8.0)

## 2012-05-04 MED ORDER — INFLUENZA VIRUS VACC SPLIT PF IM SUSP: 0.5000 mL | Freq: Once | INTRAMUSCULAR | Status: DC

## 2012-05-04 NOTE — Progress Notes (Signed)
Numbness in fingers. DBP 90, no proteinuria, no headache or vision changes. Pt states she is usually stressed by the time she gets to clinic.  Monitor BP next visit.

## 2012-05-04 NOTE — Progress Notes (Signed)
Pulse 70 Has some numbness in her hands

## 2012-05-04 NOTE — Patient Instructions (Signed)
Pregnancy - Third Trimester  The third trimester of pregnancy (the last 3 months) is a period of the most rapid growth for you and your baby. The baby approaches a length of 20 inches and a weight of 6 to 10 pounds. The baby is adding on fat and getting ready for life outside your body. While inside, babies have periods of sleeping and waking, suck their thumbs, and hiccups. You can often feel small contractions of the uterus. This is false labor. It is also called Braxton-Hicks contractions. This is like a practice for labor. The usual problems in this stage of pregnancy include more difficulty breathing, swelling of the hands and feet from water retention, and having to urinate more often because of the uterus and baby pressing on your bladder.   PRENATAL EXAMS  · Blood work may continue to be done during prenatal exams. These tests are done to check on your health and the probable health of your baby. Blood work is used to follow your blood levels (hemoglobin). Anemia (low hemoglobin) is common during pregnancy. Iron and vitamins are given to help prevent this. You may also continue to be checked for diabetes. Some of the past blood tests may be done again.  · The size of the uterus is measured during each visit. This makes sure your baby is growing properly according to your pregnancy dates.  · Your blood pressure is checked every prenatal visit. This is to make sure you are not getting toxemia.  · Your urine is checked every prenatal visit for infection, diabetes and protein.  · Your weight is checked at each visit. This is done to make sure gains are happening at the suggested rate and that you and your baby are growing normally.  · Sometimes, an ultrasound is performed to confirm the position and the proper growth and development of the baby. This is a test done that bounces harmless sound waves off the baby so your caregiver can more accurately determine due dates.  · Discuss the type of pain medication and  anesthesia you will have during your labor and delivery.  · Discuss the possibility and anesthesia if a Cesarean Section might be necessary.  · Inform your caregiver if there is any mental or physical violence at home.  Sometimes, a specialized non-stress test, contraction stress test and biophysical profile are done to make sure the baby is not having a problem. Checking the amniotic fluid surrounding the baby is called an amniocentesis. The amniotic fluid is removed by sticking a needle into the belly (abdomen). This is sometimes done near the end of pregnancy if an early delivery is required. In this case, it is done to help make sure the baby's lungs are mature enough for the baby to live outside of the womb. If the lungs are not mature and it is unsafe to deliver the baby, an injection of cortisone medication is given to the mother 1 to 2 days before the delivery. This helps the baby's lungs mature and makes it safer to deliver the baby.  CHANGES OCCURING IN THE THIRD TRIMESTER OF PREGNANCY  Your body goes through many changes during pregnancy. They vary from person to person. Talk to your caregiver about changes you notice and are concerned about.  · During the last trimester, you have probably had an increase in your appetite. It is normal to have cravings for certain foods. This varies from person to person and pregnancy to pregnancy.  · You may begin to   get stretch marks on your hips, abdomen, and breasts. These are normal changes in the body during pregnancy. There are no exercises or medications to take which prevent this change.  · Constipation may be treated with a stool softener or adding bulk to your diet. Drinking lots of fluids, fiber in vegetables, fruits, and whole grains are helpful.  · Exercising is also helpful. If you have been very active up until your pregnancy, most of these activities can be continued during your pregnancy. If you have been less active, it is helpful to start an exercise  program such as walking. Consult your caregiver before starting exercise programs.  · Avoid all smoking, alcohol, un-prescribed drugs, herbs and "street drugs" during your pregnancy. These chemicals affect the formation and growth of the baby. Avoid chemicals throughout the pregnancy to ensure the delivery of a healthy infant.  · Backache, varicose veins and hemorrhoids may develop or get worse.  · You will tire more easily in the third trimester, which is normal.  · The baby's movements may be stronger and more often.  · You may become short of breath easily.  · Your belly button may stick out.  · A yellow discharge may leak from your breasts called colostrum.  · You may have a bloody mucus discharge. This usually occurs a few days to a week before labor begins.  HOME CARE INSTRUCTIONS   · Keep your caregiver's appointments. Follow your caregiver's instructions regarding medication use, exercise, and diet.  · During pregnancy, you are providing food for you and your baby. Continue to eat regular, well-balanced meals. Choose foods such as meat, fish, milk and other low fat dairy products, vegetables, fruits, and whole-grain breads and cereals. Your caregiver will tell you of the ideal weight gain.  · A physical sexual relationship may be continued throughout pregnancy if there are no other problems such as early (premature) leaking of amniotic fluid from the membranes, vaginal bleeding, or belly (abdominal) pain.  · Exercise regularly if there are no restrictions. Check with your caregiver if you are unsure of the safety of your exercises. Greater weight gain will occur in the last 2 trimesters of pregnancy. Exercising helps:  · Control your weight.  · Get you in shape for labor and delivery.  · You lose weight after you deliver.  · Rest a lot with legs elevated, or as needed for leg cramps or low back pain.  · Wear a good support or jogging bra for breast tenderness during pregnancy. This may help if worn during  sleep. Pads or tissues may be used in the bra if you are leaking colostrum.  · Do not use hot tubs, steam rooms, or saunas.  · Wear your seat belt when driving. This protects you and your baby if you are in an accident.  · Avoid raw meat, cat litter boxes and soil used by cats. These carry germs that can cause birth defects in the baby.  · It is easier to loose urine during pregnancy. Tightening up and strengthening the pelvic muscles will help with this problem. You can practice stopping your urination while you are going to the bathroom. These are the same muscles you need to strengthen. It is also the muscles you would use if you were trying to stop from passing gas. You can practice tightening these muscles up 10 times a set and repeating this about 3 times per day. Once you know what muscles to tighten up, do not perform these   exercises during urination. It is more likely to cause an infection by backing up the urine.  · Ask for help if you have financial, counseling or nutritional needs during pregnancy. Your caregiver will be able to offer counseling for these needs as well as refer you for other special needs.  · Make a list of emergency phone numbers and have them available.  · Plan on getting help from family or friends when you go home from the hospital.  · Make a trial run to the hospital.  · Take prenatal classes with the father to understand, practice and ask questions about the labor and delivery.  · Prepare the baby's room/nursery.  · Do not travel out of the city unless it is absolutely necessary and with the advice of your caregiver.  · Wear only low or no heal shoes to have better balance and prevent falling.  MEDICATIONS AND DRUG USE IN PREGNANCY  · Take prenatal vitamins as directed. The vitamin should contain 1 milligram of folic acid. Keep all vitamins out of reach of children. Only a couple vitamins or tablets containing iron may be fatal to a baby or young child when ingested.  · Avoid use  of all medications, including herbs, over-the-counter medications, not prescribed or suggested by your caregiver. Only take over-the-counter or prescription medicines for pain, discomfort, or fever as directed by your caregiver. Do not use aspirin, ibuprofen (Motrin®, Advil®, Nuprin®) or naproxen (Aleve®) unless OK'd by your caregiver.  · Let your caregiver also know about herbs you may be using.  · Alcohol is related to a number of birth defects. This includes fetal alcohol syndrome. All alcohol, in any form, should be avoided completely. Smoking will cause low birth rate and premature babies.  · Street/illegal drugs are very harmful to the baby. They are absolutely forbidden. A baby born to an addicted mother will be addicted at birth. The baby will go through the same withdrawal an adult does.  SEEK MEDICAL CARE IF:  You have any concerns or worries during your pregnancy. It is better to call with your questions if you feel they cannot wait, rather than worry about them.  DECISIONS ABOUT CIRCUMCISION  You may or may not know the sex of your baby. If you know your baby is a boy, it may be time to think about circumcision. Circumcision is the removal of the foreskin of the penis. This is the skin that covers the sensitive end of the penis. There is no proven medical need for this. Often this decision is made on what is popular at the time or based upon religious beliefs and social issues. You can discuss these issues with your caregiver or pediatrician.  SEEK IMMEDIATE MEDICAL CARE IF:   · An unexplained oral temperature above 102° F (38.9° C) develops, or as your caregiver suggests.  · You have leaking of fluid from the vagina (birth canal). If leaking membranes are suspected, take your temperature and tell your caregiver of this when you call.  · There is vaginal spotting, bleeding or passing clots. Tell your caregiver of the amount and how many pads are used.  · You develop a bad smelling vaginal discharge with  a change in the color from clear to white.  · You develop vomiting that lasts more than 24 hours.  · You develop chills or fever.  · You develop shortness of breath.  · You develop burning on urination.  · You loose more than 2 pounds of weight   or gain more than 2 pounds of weight or as suggested by your caregiver.  · You notice sudden swelling of your face, hands, and feet or legs.  · You develop belly (abdominal) pain. Round ligament discomfort is a common non-cancerous (benign) cause of abdominal pain in pregnancy. Your caregiver still must evaluate you.  · You develop a severe headache that does not go away.  · You develop visual problems, blurred or double vision.  · If you have not felt your baby move for more than 1 hour. If you think the baby is not moving as much as usual, eat something with sugar in it and lie down on your left side for an hour. The baby should move at least 4 to 5 times per hour. Call right away if your baby moves less than that.  · You fall, are in a car accident or any kind of trauma.  · There is mental or physical violence at home.  Document Released: 07/14/2001 Document Revised: 10/12/2011 Document Reviewed: 01/16/2009  ExitCare® Patient Information ©2013 [employer].

## 2012-05-04 NOTE — Addendum Note (Signed)
Addended by: Gerome Apley on: 05/04/2012 11:59 AM   Modules accepted: Orders

## 2012-05-18 ENCOUNTER — Ambulatory Visit (INDEPENDENT_AMBULATORY_CARE_PROVIDER_SITE_OTHER): Payer: BC Managed Care – PPO | Admitting: Advanced Practice Midwife

## 2012-05-18 VITALS — BP 145/100 | Temp 97.5°F | Wt 233.8 lb

## 2012-05-18 DIAGNOSIS — IMO0002 Reserved for concepts with insufficient information to code with codable children: Secondary | ICD-10-CM

## 2012-05-18 DIAGNOSIS — Z3400 Encounter for supervision of normal first pregnancy, unspecified trimester: Secondary | ICD-10-CM

## 2012-05-18 DIAGNOSIS — O09529 Supervision of elderly multigravida, unspecified trimester: Secondary | ICD-10-CM

## 2012-05-18 LAB — POCT URINALYSIS DIP (DEVICE)
Ketones, ur: NEGATIVE mg/dL
Protein, ur: 30 mg/dL — AB
Specific Gravity, Urine: >=1.03 (ref 1.005–1.030)
pH: 5.5 (ref 5.0–8.0)

## 2012-05-18 LAB — OB RESULTS CONSOLE GBS: GBS: NEGATIVE

## 2012-05-18 NOTE — Addendum Note (Signed)
Addended by: Franchot Mimes on: 05/18/2012 01:58 PM   Modules accepted: Orders

## 2012-05-18 NOTE — Progress Notes (Signed)
Pulse-75 BP Recheck 135/94

## 2012-05-18 NOTE — Progress Notes (Signed)
Doing well.  Good fetal movement, denies vaginal bleeding, LOF, regular contractions.  GBS and GCC collected today.

## 2012-05-25 ENCOUNTER — Encounter: Payer: Self-pay | Admitting: *Deleted

## 2012-05-25 ENCOUNTER — Encounter: Payer: Self-pay | Admitting: Advanced Practice Midwife

## 2012-05-25 ENCOUNTER — Ambulatory Visit (INDEPENDENT_AMBULATORY_CARE_PROVIDER_SITE_OTHER): Payer: BC Managed Care – PPO | Admitting: Advanced Practice Midwife

## 2012-05-25 VITALS — BP 139/94 | Temp 97.6°F | Wt 232.6 lb

## 2012-05-25 DIAGNOSIS — O139 Gestational [pregnancy-induced] hypertension without significant proteinuria, unspecified trimester: Secondary | ICD-10-CM

## 2012-05-25 DIAGNOSIS — O09529 Supervision of elderly multigravida, unspecified trimester: Secondary | ICD-10-CM

## 2012-05-25 DIAGNOSIS — IMO0002 Reserved for concepts with insufficient information to code with codable children: Secondary | ICD-10-CM

## 2012-05-25 DIAGNOSIS — O133 Gestational [pregnancy-induced] hypertension without significant proteinuria, third trimester: Secondary | ICD-10-CM

## 2012-05-25 LAB — CBC
HCT: 34 % — ABNORMAL LOW (ref 36.0–46.0)
Hemoglobin: 11.6 g/dL — ABNORMAL LOW (ref 12.0–15.0)
MCH: 29.7 pg (ref 26.0–34.0)
MCV: 87 fL (ref 78.0–100.0)
RBC: 3.91 MIL/uL (ref 3.87–5.11)

## 2012-05-25 LAB — COMPREHENSIVE METABOLIC PANEL
BUN: 7 mg/dL (ref 6–23)
CO2: 21 mEq/L (ref 19–32)
Creat: 0.65 mg/dL (ref 0.50–1.10)
Glucose, Bld: 74 mg/dL (ref 70–99)
Sodium: 136 mEq/L (ref 135–145)
Total Bilirubin: 0.4 mg/dL (ref 0.3–1.2)
Total Protein: 5.9 g/dL — ABNORMAL LOW (ref 6.0–8.3)

## 2012-05-25 LAB — POCT URINALYSIS DIP (DEVICE)
Bilirubin Urine: NEGATIVE
Glucose, UA: NEGATIVE mg/dL
Hgb urine dipstick: NEGATIVE
Ketones, ur: NEGATIVE mg/dL
Specific Gravity, Urine: 1.015 (ref 1.005–1.030)

## 2012-05-25 NOTE — Progress Notes (Signed)
Pulse: 74

## 2012-05-25 NOTE — Progress Notes (Signed)
Denies headaches or visual changes. No edema. Lost 1 pound.  Not many contractions. Reviewed signs of labor.  Will add one hour of rest per day. Still working at [employer]. Will get baseline labs and Protein/Creat Ratio today. Will watch BP. If goes above 100 diastolic, may need meds. Informed cultures and GBS negative.

## 2012-05-25 NOTE — Patient Instructions (Addendum)
Normal Labor and Delivery  Your caregiver must first be sure you are in labor. Signs of labor include:  · You may pass what is called "the mucus plug" before labor begins. This is a small amount of blood stained mucus.  · Regular uterine contractions.  · The time between contractions get closer together.  · The discomfort and pain gradually gets more intense.  · Pains are mostly located in the back.  · Pains get worse when walking.  · The cervix (the opening of the uterus becomes thinner (begins to efface) and opens up (dilates).  Once you are in labor and admitted into the hospital or care center, your caregiver will do the following:  · A complete physical examination.  · Check your vital signs (blood pressure, pulse, temperature and the fetal heart rate).  · Do a vaginal examination (using a sterile glove and lubricant) to determine:  · The position (presentation) of the baby (head [vertex] or buttock first).  · The level (station) of the baby's head in the birth canal.  · The effacement and dilatation of the cervix.  · You may have your pubic hair shaved and be given an enema depending on your caregiver and the circumstance.  · An electronic monitor is usually placed on your abdomen. The monitor follows the length and intensity of the contractions, as well as the baby's heart rate.  · Usually, your caregiver will insert an IV in your arm with a bottle of sugar water. This is done as a precaution so that medications can be given to you quickly during labor or delivery.  NORMAL LABOR AND DELIVERY IS DIVIDED UP INTO 3 STAGES:  First Stage  This is when regular contractions begin and the cervix begins to efface and dilate. This stage can last from 3 to 15 hours. The end of the first stage is when the cervix is 100% effaced and 10 centimeters dilated. Pain medications may be given by   · Injection (morphine, demerol, etc.)  · Regional anesthesia (spinal, caudal or epidural, anesthetics given in different locations of  the spine). Paracervical pain medication may be given, which is an injection of and anesthetic on each side of the cervix.  A pregnant woman may request to have "Natural Childbirth" which is not to have any medications or anesthesia during her labor and delivery.  Second Stage  This is when the baby comes down through the birth canal (vagina) and is born. This can take 1 to 4 hours. As the baby's head comes down through the birth canal, you may feel like you are going to have a bowel movement. You will get the urge to bear down and push until the baby is delivered. As the baby's head is being delivered, the caregiver will decide if an episiotomy (a cut in the perineum and vagina area) is needed to prevent tearing of the tissue in this area. The episiotomy is sewn up after the delivery of the baby and placenta. Sometimes a mask with nitrous oxide is given for the mother to breath during the delivery of the baby to help if there is too much pain. The end of Stage 2 is when the baby is fully delivered. Then when the umbilical cord stops pulsating it is clamped and cut.  Third Stage  The third stage begins after the baby is completely delivered and ends after the placenta (afterbirth) is delivered. This usually takes 5 to 30 minutes. After the placenta is delivered, a medication   is given either by intravenous or injection to help contract the uterus and prevent bleeding. The third stage is not painful and pain medication is usually not necessary. If an episiotomy was done, it is repaired at this time.  After the delivery, the mother is watched and monitored closely for 1 to 2 hours to make sure there is no postpartum bleeding (hemorrhage). If there is a lot of bleeding, medication is given to contract the uterus and stop the bleeding.  Document Released: 04/28/2008 Document Revised: 10/12/2011 Document Reviewed: 04/28/2008  ExitCare® Patient Information ©2013 [employer].

## 2012-05-26 ENCOUNTER — Other Ambulatory Visit: Payer: Self-pay | Admitting: Advanced Practice Midwife

## 2012-05-26 ENCOUNTER — Ambulatory Visit (HOSPITAL_COMMUNITY)
Admission: RE | Admit: 2012-05-26 | Discharge: 2012-05-26 | Disposition: A | Discharge status: Home / Self Care | Payer: BC Managed Care – PPO | Source: Ambulatory Visit | Attending: Advanced Practice Midwife | Admitting: Advanced Practice Midwife

## 2012-05-26 DIAGNOSIS — O09519 Supervision of elderly primigravida, unspecified trimester: Secondary | ICD-10-CM | POA: Insufficient documentation

## 2012-05-26 DIAGNOSIS — O289 Unspecified abnormal findings on antenatal screening of mother: Secondary | ICD-10-CM | POA: Insufficient documentation

## 2012-05-26 DIAGNOSIS — IMO0002 Reserved for concepts with insufficient information to code with codable children: Secondary | ICD-10-CM

## 2012-05-26 DIAGNOSIS — O358XX0 Maternal care for other (suspected) fetal abnormality and damage, not applicable or unspecified: Secondary | ICD-10-CM | POA: Insufficient documentation

## 2012-05-26 LAB — PROTEIN / CREATININE RATIO, URINE
Creatinine, Urine: 112.1 mg/dL
Total Protein, Urine: 7 mg/dL

## 2012-06-01 ENCOUNTER — Encounter: Payer: Self-pay | Admitting: Obstetrics and Gynecology

## 2012-06-01 ENCOUNTER — Ambulatory Visit (INDEPENDENT_AMBULATORY_CARE_PROVIDER_SITE_OTHER): Payer: BC Managed Care – PPO | Admitting: Obstetrics and Gynecology

## 2012-06-01 VITALS — BP 142/97 | Temp 97.5°F | Wt 231.2 lb

## 2012-06-01 DIAGNOSIS — O133 Gestational [pregnancy-induced] hypertension without significant proteinuria, third trimester: Secondary | ICD-10-CM | POA: Insufficient documentation

## 2012-06-01 DIAGNOSIS — O139 Gestational [pregnancy-induced] hypertension without significant proteinuria, unspecified trimester: Secondary | ICD-10-CM

## 2012-06-01 LAB — POCT URINALYSIS DIP (DEVICE)
Ketones, ur: NEGATIVE mg/dL
Protein, ur: 30 mg/dL — AB
Specific Gravity, Urine: 1.025 (ref 1.005–1.030)
pH: 6.5 (ref 5.0–8.0)

## 2012-06-01 LAB — COMPREHENSIVE METABOLIC PANEL
CO2: 20 mEq/L (ref 19–32)
Calcium: 8.6 mg/dL (ref 8.4–10.5)
Creat: 0.74 mg/dL (ref 0.50–1.10)
Glucose, Bld: 75 mg/dL (ref 70–99)
Total Bilirubin: 0.3 mg/dL (ref 0.3–1.2)
Total Protein: 6.2 g/dL (ref 6.0–8.3)

## 2012-06-01 NOTE — Progress Notes (Signed)
Pulse: 76

## 2012-06-01 NOTE — Progress Notes (Signed)
41 yo G1 Gestational HTN. Baseline 1st tri had nl. BP. BPs consistenetly elevated since 32 wks. PreE labs WNL 1 wk ago. [place] at 37+: 52nd%ile, AFI 18. No H/A's, visual sx. Discussed FATs and IOL at 40 wk, sooner if preE and she declines IOL. NST today and 2x/wk and return with 24 hr urine, labs.

## 2012-06-01 NOTE — Patient Instructions (Signed)

## 2012-06-03 LAB — PROTEIN, URINE, 24 HOUR: Protein, Urine: 4 mg/dL

## 2012-06-03 LAB — CREATININE CLEARANCE, URINE, 24 HOUR
Creatinine Clearance: 159 mL/min — ABNORMAL HIGH (ref 75–115)
Creatinine, Urine: 77.2 mg/dL
Creatinine: 0.74 mg/dL (ref 0.50–1.10)

## 2012-06-06 ENCOUNTER — Ambulatory Visit (INDEPENDENT_AMBULATORY_CARE_PROVIDER_SITE_OTHER): Payer: BC Managed Care – PPO | Admitting: *Deleted

## 2012-06-06 VITALS — BP 126/87 | Wt 234.0 lb

## 2012-06-06 DIAGNOSIS — O139 Gestational [pregnancy-induced] hypertension without significant proteinuria, unspecified trimester: Secondary | ICD-10-CM

## 2012-06-06 NOTE — Progress Notes (Signed)
P-78 

## 2012-06-07 ENCOUNTER — Inpatient Hospital Stay (HOSPITAL_COMMUNITY)
Admission: AD | Admit: 2012-06-07 | Discharge: 2012-06-08 | Disposition: A | Discharge status: Home / Self Care | Payer: BC Managed Care – PPO | Source: Ambulatory Visit | Attending: Family Medicine | Admitting: Family Medicine

## 2012-06-07 ENCOUNTER — Encounter (HOSPITAL_COMMUNITY): Payer: Self-pay | Admitting: *Deleted

## 2012-06-07 DIAGNOSIS — O479 False labor, unspecified: Secondary | ICD-10-CM

## 2012-06-07 DIAGNOSIS — O133 Gestational [pregnancy-induced] hypertension without significant proteinuria, third trimester: Secondary | ICD-10-CM

## 2012-06-07 DIAGNOSIS — O139 Gestational [pregnancy-induced] hypertension without significant proteinuria, unspecified trimester: Secondary | ICD-10-CM

## 2012-06-07 LAB — URINALYSIS, ROUTINE W REFLEX MICROSCOPIC
Bilirubin Urine: NEGATIVE
Glucose, UA: NEGATIVE mg/dL
Specific Gravity, Urine: 1.025 (ref 1.005–1.030)
Urobilinogen, UA: 0.2 mg/dL (ref 0.0–1.0)

## 2012-06-07 LAB — PROTEIN / CREATININE RATIO, URINE
Protein Creatinine Ratio: 0.13 (ref 0.00–0.15)
Total Protein, Urine: 40.9 mg/dL

## 2012-06-07 LAB — URINE MICROSCOPIC-ADD ON

## 2012-06-07 NOTE — MAU Note (Signed)
Will accept blood expanders but nothing from human origin.

## 2012-06-07 NOTE — Progress Notes (Signed)
Pt brought in Health Care Power of [place]. Copy made and on chart.

## 2012-06-07 NOTE — MAU Note (Signed)
Pt states felt something leak out. Fern slide made before sve.

## 2012-06-07 NOTE — MAU Provider Note (Signed)
History     CSN: [id number]  Arrival date and time: 06/07/12 2017   First Provider Initiated Contact with Patient 06/07/12 2132  .   No chief complaint on file.  HPI Pt is a 41 yo G1 at [redacted]w[redacted]d presenting for contractions since 0400. Feels contractions every 3-4 minutes consistently. No LOF, no bleeding only bloody show. Good fetal movement. Seen in [facility]. Has gestational HTN, not on medication for blood pressure. No other reported complications of pregnancy, although patient prefers no blood products due to religious preference.   OB History    Grav Para Term Preterm Abortions TAB SAB Ect Mult Living   1 0 0 0 0 0 0 0 0 0       Past Medical History  Diagnosis Date  . No pertinent past medical history     Past Surgical History  Procedure Date  . Breast reduction surgery     Family History  Problem Relation Age of Onset  . Hypertension Mother   . Kidney disease Mother   . Asthma Father   . COPD Father   . Kidney disease Sister     History  Substance Use Topics  . Smoking status: Never Smoker   . Smokeless tobacco: Never Used  . Alcohol Use: No    Allergies: No Known Allergies  Prescriptions prior to admission  Medication Sig Dispense Refill  . ferrous fumarate (HEMOCYTE - 106 MG FE) 325 (106 FE) MG TABS Take 1 tablet by mouth.      . Prenatal Vit-Fe Fumarate-FA (PRENATAL MULTIVITAMIN) TABS Take 1 tablet by mouth daily.        Review of Systems  Constitutional: Negative for fever.  Respiratory: Negative for shortness of breath.   Cardiovascular: Negative for chest pain.  Gastrointestinal: Negative for nausea.  Genitourinary: Negative for dysuria.  Musculoskeletal: Negative for back pain.  Skin: Negative for rash.  Neurological: Negative for dizziness and headaches.   Physical Exam   Blood pressure 124/93, pulse 55, last menstrual period 08/23/2011.  Physical Exam  Constitutional: She is oriented to person, place, and time. She appears  well-developed and well-nourished.  HENT:  Head: Normocephalic and atraumatic.  Cardiovascular: Normal rate and regular rhythm.   Respiratory: Effort normal and breath sounds normal.  GI: Soft. There is no tenderness.       Gravid. Toco in place.  Musculoskeletal: Normal range of motion. She exhibits no edema and no tenderness.  Neurological: She is alert and oriented to person, place, and time.  Skin: Skin is dry. No rash noted.  Psychiatric: She has a normal mood and affect.    MAU Course  Procedures Dilation: 1 Effacement (%): 90 Cervical Position: Posterior Presentation: Vertex Exam by:: Quintella Baton RNC  FHT 140's moderate variability, no decels. Irregular contractions every 3-7 minutes.  Slide collected from perineum, does not show ferning.  MDM 9:45 pm- Blood pressures slightly elevated. Will send UA and UPC. Had 24 hour urine and labs last week that were wnl. Will monitor for an hour and re-evaluate. 10:45pm- UA reviewed, positive ketones and protein. Will send for culture. Awaiting UPC. Patient continues to have irregular contractions. Discussed with Philipp Deputy, CNM and we will wait for ratio to return. Patient updated 11:50pm- UPC 0.13. Patient uncomfortable on side, B/P noted to be elevated on her back to 150/100. Will turn on side and recheck pressure. 12:00am- BP improved on left side to 127/88. Patient continues to have contractions. Cervix 2cm, Remains very posterior, verified by Corrie Dandy,  RN  Assessment and Plan  41 yo G1 at 39.3 presenting for labor evaluation.  BP elevated likely due to discomfort from contractions. Given Percocet prior to discharge since she has a ride home. Will follow up in clinic for BP Given labor precautions Discussed with Philipp Deputy, CNM. Will discharge home.  [place], AMBER 06/07/2012, 9:33 PM   I agree with the above. Cam Hai 7:36 AM 06/08/2012

## 2012-06-07 NOTE — Progress Notes (Signed)
Pt turned self back to [place]

## 2012-06-07 NOTE — MAU Note (Signed)
Pt states her sister is allergic to anesthesia Zemuron

## 2012-06-08 ENCOUNTER — Other Ambulatory Visit: Payer: BC Managed Care – PPO

## 2012-06-08 ENCOUNTER — Encounter (HOSPITAL_COMMUNITY): Payer: Self-pay | Admitting: *Deleted

## 2012-06-08 ENCOUNTER — Inpatient Hospital Stay (HOSPITAL_COMMUNITY)
Admission: AD | Admit: 2012-06-08 | Discharge: 2012-06-10 | DRG: 373 | Disposition: A | Discharge status: Home / Self Care | Payer: BC Managed Care – PPO | Source: Ambulatory Visit | Attending: Obstetrics & Gynecology | Admitting: Obstetrics & Gynecology

## 2012-06-08 DIAGNOSIS — IMO0002 Reserved for concepts with insufficient information to code with codable children: Secondary | ICD-10-CM

## 2012-06-08 DIAGNOSIS — O09519 Supervision of elderly primigravida, unspecified trimester: Secondary | ICD-10-CM | POA: Diagnosis present

## 2012-06-08 DIAGNOSIS — IMO0001 Reserved for inherently not codable concepts without codable children: Secondary | ICD-10-CM

## 2012-06-08 DIAGNOSIS — Z3400 Encounter for supervision of normal first pregnancy, unspecified trimester: Secondary | ICD-10-CM

## 2012-06-08 LAB — CBC
Hemoglobin: 12.2 g/dL (ref 12.0–15.0)
MCH: 30.1 pg (ref 26.0–34.0)
MCHC: 33.8 g/dL (ref 30.0–36.0)
Platelets: 309 10*3/uL (ref 150–400)
RDW: 14.4 % (ref 11.5–15.5)

## 2012-06-08 LAB — RPR: RPR Ser Ql: NONREACTIVE

## 2012-06-08 LAB — URINE CULTURE

## 2012-06-08 MED ORDER — SIMETHICONE 80 MG PO CHEW: 80.0000 mg | CHEWABLE_TABLET | ORAL | Status: DC | PRN

## 2012-06-08 MED ORDER — OXYCODONE-ACETAMINOPHEN 5-325 MG PO TABS: 1.0000 | ORAL_TABLET | ORAL | Status: DC | PRN

## 2012-06-08 MED ORDER — SENNOSIDES-DOCUSATE SODIUM 8.6-50 MG PO TABS
2.0000 | ORAL_TABLET | Freq: Every day | ORAL | Status: DC
  Administered 2012-06-08: 2 via ORAL

## 2012-06-08 MED ORDER — PRENATAL MULTIVITAMIN CH
1.0000 | ORAL_TABLET | Freq: Every day | ORAL | Status: DC
  Administered 2012-06-09 – 2012-06-10 (×2): 1 via ORAL
  Filled 2012-06-08 (×2): qty 1

## 2012-06-08 MED ORDER — CITRIC ACID-SODIUM CITRATE 334-500 MG/5ML PO SOLN: 30.0000 mL | ORAL | Status: DC | PRN

## 2012-06-08 MED ORDER — TERBUTALINE SULFATE 1 MG/ML IJ SOLN: 0.2500 mg | Freq: Once | INTRAMUSCULAR | Status: DC | PRN

## 2012-06-08 MED ORDER — LACTATED RINGERS IV SOLN
INTRAVENOUS | Status: DC
  Administered 2012-06-08: 125 mL/h via INTRAVENOUS
  Administered 2012-06-08: 10:00:00 via INTRAVENOUS

## 2012-06-08 MED ORDER — BENZOCAINE-MENTHOL 20-0.5 % EX AERO
1.0000 "application " | INHALATION_SPRAY | CUTANEOUS | Status: DC | PRN
  Filled 2012-06-08: qty 56

## 2012-06-08 MED ORDER — NALBUPHINE HCL 10 MG/ML IJ SOLN
10.0000 mg | INTRAMUSCULAR | Status: DC | PRN
  Administered 2012-06-08: 10 mg via INTRAVENOUS
  Filled 2012-06-08: qty 1

## 2012-06-08 MED ORDER — WITCH HAZEL-GLYCERIN EX PADS: 1.0000 "application " | MEDICATED_PAD | CUTANEOUS | Status: DC | PRN

## 2012-06-08 MED ORDER — DIBUCAINE 1 % RE OINT: 1.0000 "application " | TOPICAL_OINTMENT | RECTAL | Status: DC | PRN

## 2012-06-08 MED ORDER — ZOLPIDEM TARTRATE 5 MG PO TABS: 5.0000 mg | ORAL_TABLET | Freq: Every evening | ORAL | Status: DC | PRN

## 2012-06-08 MED ORDER — OXYTOCIN BOLUS FROM INFUSION: 500.0000 mL | INTRAVENOUS | Status: DC

## 2012-06-08 MED ORDER — OXYTOCIN 40 UNITS IN LACTATED RINGERS INFUSION - SIMPLE MED
1.0000 m[IU]/min | INTRAVENOUS | Status: DC
  Administered 2012-06-08: 1 m[IU]/min via INTRAVENOUS

## 2012-06-08 MED ORDER — NALBUPHINE SYRINGE 5 MG/0.5 ML
INJECTION | INTRAMUSCULAR | Status: AC
  Administered 2012-06-08: 10 mg
  Filled 2012-06-08: qty 1

## 2012-06-08 MED ORDER — ACETAMINOPHEN 325 MG PO TABS: 650.0000 mg | ORAL_TABLET | ORAL | Status: DC | PRN

## 2012-06-08 MED ORDER — SODIUM CHLORIDE 0.9 % IJ SOLN: 3.0000 mL | INTRAMUSCULAR | Status: DC | PRN

## 2012-06-08 MED ORDER — DIPHENHYDRAMINE HCL 25 MG PO CAPS: 25.0000 mg | ORAL_CAPSULE | Freq: Four times a day (QID) | ORAL | Status: DC | PRN

## 2012-06-08 MED ORDER — ONDANSETRON HCL 4 MG PO TABS: 4.0000 mg | ORAL_TABLET | ORAL | Status: DC | PRN

## 2012-06-08 MED ORDER — LANOLIN HYDROUS EX OINT: TOPICAL_OINTMENT | CUTANEOUS | Status: DC | PRN

## 2012-06-08 MED ORDER — SODIUM CHLORIDE 0.9 % IJ SOLN: 3.0000 mL | Freq: Two times a day (BID) | INTRAMUSCULAR | Status: DC

## 2012-06-08 MED ORDER — ONDANSETRON HCL 4 MG/2ML IJ SOLN: 4.0000 mg | Freq: Four times a day (QID) | INTRAMUSCULAR | Status: DC | PRN

## 2012-06-08 MED ORDER — OXYCODONE-ACETAMINOPHEN 5-325 MG PO TABS: 1.0000 | ORAL_TABLET | Freq: Once | ORAL | Status: DC

## 2012-06-08 MED ORDER — OXYCODONE-ACETAMINOPHEN 5-325 MG PO TABS
1.0000 | ORAL_TABLET | Freq: Once | ORAL | Status: AC
  Administered 2012-06-08: 1 via ORAL
  Filled 2012-06-08: qty 1

## 2012-06-08 MED ORDER — OXYTOCIN 40 UNITS IN LACTATED RINGERS INFUSION - SIMPLE MED
INTRAVENOUS | Status: AC
  Filled 2012-06-08: qty 1000

## 2012-06-08 MED ORDER — SODIUM CHLORIDE 0.9 % IV SOLN: 250.0000 mL | INTRAVENOUS | Status: DC | PRN

## 2012-06-08 MED ORDER — LIDOCAINE HCL (PF) 1 % IJ SOLN
30.0000 mL | INTRAMUSCULAR | Status: DC | PRN
  Filled 2012-06-08: qty 30

## 2012-06-08 MED ORDER — ONDANSETRON HCL 4 MG/2ML IJ SOLN: 4.0000 mg | INTRAMUSCULAR | Status: DC | PRN

## 2012-06-08 MED ORDER — OXYTOCIN 40 UNITS IN LACTATED RINGERS INFUSION - SIMPLE MED
62.5000 mL/h | INTRAVENOUS | Status: DC
  Filled 2012-06-08: qty 1000

## 2012-06-08 MED ORDER — IBUPROFEN 600 MG PO TABS: 600.0000 mg | ORAL_TABLET | Freq: Four times a day (QID) | ORAL | Status: DC | PRN

## 2012-06-08 MED ORDER — IBUPROFEN 600 MG PO TABS
600.0000 mg | ORAL_TABLET | Freq: Four times a day (QID) | ORAL | Status: DC
  Administered 2012-06-08 – 2012-06-10 (×7): 600 mg via ORAL
  Filled 2012-06-08 (×7): qty 1

## 2012-06-08 MED ORDER — LACTATED RINGERS IV SOLN: 500.0000 mL | INTRAVENOUS | Status: DC | PRN

## 2012-06-08 MED ORDER — TETANUS-DIPHTH-ACELL PERTUSSIS 5-2.5-18.5 LF-MCG/0.5 IM SUSP: 0.5000 mL | Freq: Once | INTRAMUSCULAR | Status: DC

## 2012-06-08 MED ORDER — METHYLERGONOVINE MALEATE 0.2 MG PO TABS: 0.2000 mg | ORAL_TABLET | ORAL | Status: DC | PRN

## 2012-06-08 MED ORDER — METHYLERGONOVINE MALEATE 0.2 MG/ML IJ SOLN: 0.2000 mg | INTRAMUSCULAR | Status: DC | PRN

## 2012-06-08 NOTE — Progress Notes (Signed)
Shelby Duran is a 41 y.o. G1P0000 at [redacted]w[redacted]d  admitted for active labor  Subjective: Pt is feeling well. Reports adequate pain relief with nubain.  Objective: BP 146/74  Pulse 87  Temp 98.8 F (37.1 C) (Oral)  Resp 18  Ht 5' 7.5" (1.715 m)  Wt 103.874 kg (229 lb)  BMI 35.34 kg/m2  LMP 08/23/2011      FHT:  FHR: 130 bpm, variability: moderate,  accelerations:  Present,  decelerations:  Absent UC:   regular, every 12 minutes SVE:   Dilation: 7 Effacement (%): 100 Station: -1 Exam by:: Montez Morita, RNC  Labs: Lab Results  Component Value Date   WBC 13.5* 06/08/2012   HGB 12.2 06/08/2012   HCT 36.1 06/08/2012   MCV 89.1 06/08/2012   PLT 309 06/08/2012    Assessment / Plan: Protracted active phase  Labor: Stalled progress 2/2 spaced contractions. AROM  Fetal Wellbeing:  Category I Pain Control:  Nubain prn I/D:  GBS negative Anticipated MOD:  NSVD  Tawnya Crook 06/08/2012, 2:54 PM

## 2012-06-08 NOTE — MAU Note (Signed)
Was here during the night, was 2 cm.  States ctx's are closer and stronger.  ? Leaking when arrived... Husband states had a lot of clear fluid.

## 2012-06-08 NOTE — MAU Note (Signed)
Patient is in for labor eval. She states that she is having contractions q3-42mins since 4am. She left MAU at midnight and was given percocet. She reports good fetal movement, denies lof.

## 2012-06-08 NOTE — MAU Provider Note (Signed)
Chart reviewed and agree with management and plan.  

## 2012-06-08 NOTE — H&P (Signed)
Shelby Duran is a 41 y.o. female presenting for labor evaluation. Maternal Medical History:  Reason for admission: Reason for admission: contractions.  Reason for Admission:   nauseaContractions: Onset was yesterday.   Frequency: regular.   Duration is approximately 70 seconds.   Perceived severity is strong.    Fetal activity: Perceived fetal activity is normal.   Last perceived fetal movement was within the past hour.    Prenatal Complications - Diabetes: none.    OB History    Grav Para Term Preterm Abortions TAB SAB Ect Mult Living   1 0 0 0 0 0 0 0 0 0      Past Medical History  Diagnosis Date  . No pertinent past medical history    Past Surgical History  Procedure Date  . Breast reduction surgery    Family History: family history includes Asthma in her father; COPD in her father; Hypertension in her mother; and Kidney disease in her mother and sister. Social History:  reports that she has never smoked. She has never used smokeless tobacco. She reports that she does not drink alcohol or use illicit drugs.   Prenatal Transfer Tool  Maternal Diabetes: No Genetic Screening: Abnormal:  Results: Elevated risk of Trisomy 21, Other: Normal results on Free Fetal DNA ([facility]) test.  Maternal Ultrasounds/Referrals: Abnormal:  Findings:   Fetal renal pyelectasis, thickened nuchal fold.  Fetal Ultrasounds or other Referrals:  None Maternal Substance Abuse:  No Significant Maternal Medications:  None Significant Maternal Lab Results:  None Other Comments:  None  Review of Systems  Gastrointestinal: Negative for nausea, vomiting, diarrhea and constipation.  Genitourinary: Negative for dysuria and frequency.  Neurological: Negative for headaches.      Last menstrual period 08/23/2011. Maternal Exam:  Uterine Assessment: Contraction strength is firm.  Contraction duration is 70 seconds. Contraction frequency is regular.   Abdomen: Patient reports no abdominal  tenderness. Estimated fetal weight is 7#.   Fetal presentation: breech  Introitus: Normal vulva. Normal vagina.  Ferning test: not done.  Nitrazine test: not done.  Pelvis: adequate for delivery.   Cervix: Cervix evaluated by digital exam.     Fetal Exam Fetal Monitor Review: Mode: ultrasound.   Baseline rate: 145.  Variability: moderate (6-25 bpm).   Pattern: accelerations present and no decelerations.    Fetal State Assessment: Category I - tracings are normal.     Physical Exam  Nursing note and vitals reviewed. Constitutional: She is oriented to person, place, and time. She appears well-developed and well-nourished.  GI: Soft.  Genitourinary: Vagina normal and uterus normal.        Cervix: 5/90/-1 BBOW  Neurological: She is alert and oriented to person, place, and time.  Skin: Skin is warm and dry.  Psychiatric: She has a normal mood and affect. Her behavior is normal.        Breathing through ctx.  Desires pain meds.     Prenatal labs: ABO, Rh: A/Positive/-- (04/08 0000) Antibody: Negative (04/08 0000) Rubella: Immune (04/08 0000) RPR: NON REAC (08/07 0958)  HBsAg: Negative (04/08 0000)  HIV: Non-reactive (04/08 0000)  GBS:   Negative  Assessment/Plan: 41 y.o. G1P0000 @ [redacted]w[redacted]d Active labor at term Reassuring M-F status Admit to labor and delivery for CNM management.  D/W attending No residents.  Epidural as desired  Tawnya Crook 06/08/2012, 9:26 AM

## 2012-06-09 LAB — CBC
HCT: 32.9 % — ABNORMAL LOW (ref 36.0–46.0)
MCH: 29.4 pg (ref 26.0–34.0)
MCV: 88.7 fL (ref 78.0–100.0)
Platelets: 339 10*3/uL (ref 150–400)
RBC: 3.71 MIL/uL — ABNORMAL LOW (ref 3.87–5.11)
RDW: 14.5 % (ref 11.5–15.5)

## 2012-06-09 NOTE — Progress Notes (Signed)
Post Partum Day 1  Subjective: Pt is doing well. Reports no pain, is taking ibuprofen. Up ad lib, voiding, tolerating PO. Reports minimal bleeding, 2 partially soaked pads through the night. Is breastfeeding but having trouble with baby latching, lactation consult planned for today. Denies headache, fever, chills, chest pain, palpitations, shortness of breath, lower extremity edema. No flatus or bowel movement yet but pt does not feel constipated. Plans for circumcision here today. [facility]atal care was at [facility]; pt plans postpartum there as well. Pt requests mini-pill for contraception.  Objective: Blood pressure 99/66, pulse 86, temperature 98.2 F (36.8 C), temperature source Oral, resp. rate 18, height 5' 7.5" (1.715 m), weight 103.874 kg (229 lb), last menstrual period 08/23/2011, SpO2 97.00%, unknown if currently breastfeeding.  Physical Exam:  General: alert, cooperative and no distress Lochia: appropriate Uterine Fundus: firm DVT Evaluation: No evidence of DVT seen on physical exam.   Basename 06/09/12 0510 06/08/12 0940  HGB 10.9* 12.2  HCT 32.9* 36.1    Assessment/Plan: Plan for discharge tomorrow, Breastfeeding, Lactation consult, Circumcision prior to discharge and Contraception mini-pill   LOS: 1 day   Harriet Masson 06/09/2012, 6:48 AM

## 2012-06-09 NOTE — Progress Notes (Signed)
I examined pt and agree with documentation above and PA-S plan of care. MUHAMMAD,Efrain Clauson  

## 2012-06-10 MED ORDER — NORETHINDRONE 0.35 MG PO TABS: 1.0000 | ORAL_TABLET | Freq: Every day | ORAL | Status: DC

## 2012-06-10 NOTE — Discharge Summary (Signed)
Obstetric Discharge Summary Reason for Admission: onset of labor Prenatal Procedures: NST and ultrasound Intrapartum Procedures: spontaneous vaginal delivery Postpartum Procedures: none Complications-Operative and Postpartum: none Hemoglobin  Date Value Range Status  06/09/2012 10.9* 12.0 - 15.0 g/dL Final  08/08/1094 04.5   Final     HCT  Date Value Range Status  06/09/2012 32.9* 36.0 - 46.0 % Final  11/09/2011 33   Final    Physical Exam:  General: alert, cooperative and no distress Lochia: appropriate Uterine Fundus: firm Incision: n/a DVT Evaluation: No evidence of DVT seen on physical exam.  Discharge Diagnoses: Term Pregnancy-delivered  Discharge Information: Date: 06/10/2012 Activity: pelvic rest Diet: routine Medications: Ibuprofen Condition: stable Instructions: refer to practice specific booklet Discharge to: home Follow-up Information    Follow up with [facility]. Schedule an appointment as soon as possible for a visit in 6 weeks.   Contact information:   [address] [place] [address]         Patient given Rx for minipill. Encouraged to start taking after infant is routinely breastfeeding with no difficulty.  Will follow up at [facility].  All questions answered prior to discharge.  Newborn Data: Live born female  Birth Weight: 6 lb 0.5 oz (2735 g) APGAR: 8, 9  Home with mother.  HAIRFORD, AMBER 06/10/2012, 7:33 AM

## 2012-06-11 ENCOUNTER — Encounter (HOSPITAL_COMMUNITY)
Admission: RE | Admit: 2012-06-11 | Discharge: 2012-06-11 | Disposition: A | Discharge status: Home / Self Care | Payer: BC Managed Care – PPO | Source: Ambulatory Visit | Attending: Obstetrics and Gynecology | Admitting: Obstetrics and Gynecology

## 2012-06-11 DIAGNOSIS — O923 Agalactia: Secondary | ICD-10-CM | POA: Insufficient documentation

## 2012-06-11 NOTE — Discharge Summary (Signed)
I saw and examined patient and agree with above note and plan. Napoleon Form, MD

## 2012-06-17 ENCOUNTER — Ambulatory Visit (HOSPITAL_COMMUNITY)
Admission: RE | Admit: 2012-06-17 | Discharge: 2012-06-17 | Disposition: A | Discharge status: Home / Self Care | Payer: BC Managed Care – PPO | Source: Ambulatory Visit | Attending: Family Medicine | Admitting: Family Medicine

## 2012-06-17 DIAGNOSIS — O9270 Unspecified disorders of lactation: Secondary | ICD-10-CM | POA: Insufficient documentation

## 2012-06-17 NOTE — Progress Notes (Signed)
Infant Lactation Consultation Outpatient Visit Note  Patient Name: Shelby Duran Date of Birth: 06/17/1971 Birth Weight:   Gestational Age at Delivery: Gestational Age: <None> Type of Delivery:   Breastfeeding History Frequency of Breastfeeding: 2 x a day Length of Feeding: off and on with frustration Voids: qs Stools: qs  Supplementing / Method: Breastmilk 15 ml and then enfamil 2-4 ounces via bottle  At least 6 times in 24 hours Pumping:  Type of Pump:Symphony   Frequency: 2 -3 times a day  Volume:  5 ounces  Comments:    Consultation Evaluation:  Initial Feeding Assessment: Pre-feed Weight:2832 Post-feed Weight:2838 Amount Transferred:6  Comments: R.J. Has difficulty latching and maintaining the latch.  His mother reports that he latches for about 2 minutes 2 times a day.  She has been pumping twice a day and expressing about 10-15 ounces.  At this feeding he would not latch to the bare breast.  A nipple shield was applied and he did latch but mouth was not gaping and he had difficulty flanging his lips.  Tongue restriction was noted and he did not suckle effectively .  Total transfer was 6 ml.  Decision was made to give him formula via an artificial nipple.  Father was taught paced feeding and also how to flange R.J.'s lips.  He suckled well, transferred 15 ml and fell asleep.  His total feeding was only 21 ml but LC was satisfied because overall weight gain has been excellent and his technique on the bottle was good.  Informed parents that he may only eat 45-60 ml at a feeding with paced feeding but that he would eat more often, every 2-3 hours instead of every 3-4 hours.  After observing activity at the breast today I do feel that his frenulum is affecting his feeding at the breast.  Mother has had a breast reduction but with increased pumping I expect that she will have much more milk and perhaps a full MS.    Total Breast milk Transferred this Visit: 6 Total  Supplement Given: 15  Additional Interventions:   Follow-Up Plan is to   Spend time at the breast and skin to skin.  Feed RJ 45-60 ml every 2-3 hours  Pump mother's breasts every 2-3   Consider having his frenulum clipped  Weight check Tuesday but Smart Start   Follow-up with lactation if frenulum is clipped or sooner if necessary.     Terasa Orsini 06/17/2012, 11:15 AM 

## 2012-06-18 NOTE — Progress Notes (Addendum)
NST reactive on 06/06/12 

## 2012-06-18 NOTE — Progress Notes (Signed)
NST reactive on 06/06/12

## 2012-07-06 ENCOUNTER — Ambulatory Visit: Payer: BC Managed Care – PPO | Admitting: Internal Medicine

## 2012-07-11 ENCOUNTER — Ambulatory Visit: Payer: BC Managed Care – PPO | Admitting: Obstetrics and Gynecology

## 2012-08-01 ENCOUNTER — Encounter: Payer: Self-pay | Admitting: Obstetrics and Gynecology

## 2012-08-01 ENCOUNTER — Ambulatory Visit (INDEPENDENT_AMBULATORY_CARE_PROVIDER_SITE_OTHER): Payer: BC Managed Care – PPO | Admitting: Obstetrics and Gynecology

## 2012-08-01 VITALS — BP 132/83 | HR 74 | Temp 96.9°F | Ht 67.0 in | Wt 223.1 lb

## 2012-08-01 DIAGNOSIS — Z01812 Encounter for preprocedural laboratory examination: Secondary | ICD-10-CM

## 2012-08-01 DIAGNOSIS — Z30430 Encounter for insertion of intrauterine contraceptive device: Secondary | ICD-10-CM

## 2012-08-01 LAB — POCT PREGNANCY, URINE
Preg Test, Ur: NEGATIVE
Preg Test, Ur: NEGATIVE

## 2012-08-01 MED ORDER — LEVONORGESTREL 20 MCG/24HR IU IUD
INTRAUTERINE_SYSTEM | Freq: Once | INTRAUTERINE | Status: AC
  Administered 2012-08-01: 1 via INTRAUTERINE

## 2012-08-01 MED ORDER — IBUPROFEN 600 MG PO TABS: 600.0000 mg | ORAL_TABLET | Freq: Four times a day (QID) | ORAL | Status: DC | PRN

## 2012-08-01 NOTE — Patient Instructions (Signed)
Intrauterine Device Insertion Care After Refer to this sheet in the next few weeks. These instructions provide you with information on caring for yourself after your procedure. Your caregiver may also give you more specific instructions. Your treatment has been planned according to current medical practices, but problems sometimes occur. Call your caregiver if you have any problems or questions after your procedure. HOME CARE INSTRUCTIONS   Only take over-the-counter or prescription medicines for pain, discomfort, or fever as directed by your caregiver. Do not use aspirin. This may increase bleeding.  Check your IUD to make sure it is in place before you resume sexual activity. You should be able to feel the strings. If you cannot feel the strings, something may be wrong. The IUD may have fallen out of the uterus, or the uterus may have been punctured (perforated) during placement. Also, if the strings are getting longer, it may mean that the IUD is being forced out of the uterus. You no longer have full protection from pregnancy if any of these problems occur.  You may resume sexual intercourse if you are not having problems with the IUD. The IUD is considered immediately effective.  You may resume normal activities.  Keep all follow-up appointments to be sure your IUD has remained in place. After the first exam, yearly exams are advised, unless you cannot feel the strings of your IUD.  Continue to check that the IUD is still in place by feeling for the strings after every menstrual period. SEEK MEDICAL CARE IF:   You have bleeding that is heavier or lasts longer than a normal menstrual cycle.  You have a fever.  You have increasing cramps or abdominal pain not relieved with medicine.  You have abdominal pain that does not seem to be related to the same area of earlier cramping and pain.  You are lightheaded, unusually weak, or faint.  You have abnormal vaginal discharge or  smells.  You have pain during sexual intercourse.  You cannot feel the IUD strings, or the IUD string has gotten longer.  You feel the IUD at the opening of the cervix in the vagina.  You think you are pregnant, or you miss your menstrual period.  The IUD string is hurting your sex partner. Document Released: 03/18/2011 Document Revised: 10/12/2011 Document Reviewed: 03/18/2011 Exit[employer] Patient Information 2013 [employer].  

## 2012-08-01 NOTE — Progress Notes (Signed)
Patient ID: Shelby Duran, female   DOB: 1971-02-23, 41 y.o.   MRN: [id number] CC: Postpartum Care and Contraception     HPI Shelby Duran is a 41 y.o. G1P1001 now 7 wks post uncomplicated NSVD and not having concerns. Had GHTN. Desires Mirena after discussion of Mirena and Skyla devices.  Denies sexual intercourse over past 2 weeks  Past Medical History  Diagnosis Date  . No pertinent past medical history     OB History    Grav Para Term Preterm Abortions TAB SAB Ect Mult Living   1 1 1  0 0 0 0 0 0 1     # Outc Date GA Lbr Len/2nd Wgt Sex Del Anes PTL Lv   1 TRM 11/13 [redacted]w[redacted]d 13:37 / 01:08 6lb0.5oz(2.735kg) M SVD None  Yes   Comments: No anomalies, significant caput noted/ occiput posterior presentation, swelling noted around eyes      Past Surgical History  Procedure Date  . Breast reduction surgery     History   Social History  . Marital Status: Married    Spouse Name: N/A    Number of Children: N/A  . Years of Education: N/A   Occupational History  . Not on file.   Social History Main Topics  . Smoking status: Never Smoker   . Smokeless tobacco: Never Used  . Alcohol Use: No  . Drug Use: No  . Sexually Active: Yes   Other Topics Concern  . Not on file   Social History Narrative  . No narrative on file     No Known Allergies  ROS Pertinent items in HPI  PHYSICAL EXAM Filed Vitals:   08/01/12 1351  BP: 132/83  Pulse: 74  Temp: 96.9 F (36.1 C)   General: Well nourished, well developed female in no acute distress Cardiovascular: Normal rate Respiratory: Normal effort Abdomen: Soft, nontender Back: No CVAT Extremities: No edema Neurologic: Alert and oriented Uterus midposition, ULNS     PROCEDURE NOTE Patient identified, informed consent performed, signed copy in chart, time out was performed.  Urine pregnancy test negative.  Speculum placed in the vagina.  Cervix visualized.  Cleaned with Betadine x 2.  Grasped anteriourly  with a single tooth tenaculum.  Uterus sounded to 8.5cm.  Mirena IUD placed per manufacturer's recommendations.  Strings trimmed to 3 cm.   Patient given post procedure instructions and Mirena care card with expiration date.  Patient is asked to check IUD strings periodically and follow up in 4-6 weeks for IUD check. Rx ibuprofen 600 mg po q6h prn cramps.      Shelby Duran, CNM 08/01/2012 2:14 PM

## 2012-09-12 ENCOUNTER — Encounter: Payer: Self-pay | Admitting: Advanced Practice Midwife

## 2012-09-12 ENCOUNTER — Ambulatory Visit (INDEPENDENT_AMBULATORY_CARE_PROVIDER_SITE_OTHER): Payer: BC Managed Care – PPO | Admitting: Advanced Practice Midwife

## 2012-09-12 VITALS — BP 137/93 | HR 61 | Temp 96.7°F | Ht 67.5 in | Wt 235.2 lb

## 2012-09-12 DIAGNOSIS — Z30431 Encounter for routine checking of intrauterine contraceptive device: Secondary | ICD-10-CM

## 2012-09-12 NOTE — Progress Notes (Signed)
Shelby Duran is a 42 y.o. G1P1001 here for 6 week IUD check. Denies pain or bleeding, has had some occasional spotting. Happy with method. Concerned about recent weight gain. States she is breastfeeding, wants to know if there is anything she can take to help with weight loss. States she works out regularly, but hasn't been eating well, eats lots of sweets.   Past Medical History  Diagnosis Date  . No pertinent past medical history    Current Outpatient Prescriptions on File Prior to Visit  Medication Sig Dispense Refill  . ferrous fumarate (HEMOCYTE - 106 MG FE) 325 (106 FE) MG TABS Take 1 tablet by mouth.      . Prenatal Vit-Fe Fumarate-FA (PRENATAL MULTIVITAMIN) TABS Take 1 tablet by mouth daily.       No current facility-administered medications on file prior to visit.    No Known Allergies   Review of Systems  Constitutional: Negative.  Negative for fever.  Gastrointestinal: Negative.  Negative for abdominal pain.  Genitourinary: Negative.        Negative vaginal discharge or bleeding   Musculoskeletal: Negative.   Neurological: Negative.    Objective Physical Exam  Nursing note and vitals reviewed. Constitutional: She is oriented to person, place, and time. She appears well-developed and well-nourished. No distress.  Cardiovascular: Normal rate.   Pulmonary/Chest: Effort normal.  Abdominal: Soft. There is no tenderness.  Genitourinary: Uterus is not enlarged and not tender. Right adnexum displays no mass and no tenderness. Left adnexum displays no mass and no tenderness. No vaginal discharge found.  IUD strings visible protruding about 2 cm from cervical os   Musculoskeletal: Normal range of motion.  Neurological: She is alert and oriented to person, place, and time.  Skin: Skin is warm and dry.  Psychiatric: She has a normal mood and affect.    A/P: IUD check - no problems today Weight gain - rev'd healthy calorie intake, exercise, begin journaling, encouraged  to maintain healthy diet and not be too restrictive while nursing Rev'd precautions/when to call Return for annual or PRN

## 2012-09-12 NOTE — Patient Instructions (Signed)
Calorie Counting Diet A calorie counting diet requires you to eat the number of calories that are right for you in a day. Calories are the measurement of how much energy you get from the food you eat. Eating the right amount of calories is important for staying at a healthy weight. If you eat too many calories, your body will store them as fat and you may gain weight. If you eat too few calories, you may lose weight. Counting the number of calories you eat during a day will help you know if you are eating the right amount. A Registered Dietitian can determine how many calories you need in a day. The amount of calories needed varies from person to person. If your goal is to lose weight, you will need to eat fewer calories. Losing weight can benefit you if you are overweight or have health problems such as heart disease, high blood pressure, or diabetes. If your goal is to gain weight, you will need to eat more calories. Gaining weight may be necessary if you have a certain health problem that causes your body to need more energy. TIPS Whether you are increasing or decreasing the number of calories you eat during a day, it may be hard to get used to changes in what you eat and drink. The following are tips to help you keep track of the number of calories you eat.  Measure foods at home with measuring cups. This helps you know the amount of food and number of calories you are eating.  Restaurants often serve food in amounts that are larger than 1 serving. While eating out, estimate how many servings of a food you are given. For example, a serving of cooked rice is  cup or about the size of half of a fist. Knowing serving sizes will help you be aware of how much food you are eating at restaurants.  Ask for smaller portion sizes or child-size portions at restaurants.  Plan to eat half of a meal at a restaurant. Take the rest home or share the other half with a friend.  Read the Nutrition Facts panel on  food labels for calorie content and serving size. You can find out how many servings are in a package, the size of a serving, and the number of calories each serving has.  For example, a package might contain 3 cookies. The Nutrition Facts panel on that package says that 1 serving is 1 cookie. Below that, it will say there are 3 servings in the container. The calories section of the Nutrition Facts label says there are 90 calories. This means there are 90 calories in 1 cookie (1 serving). If you eat 1 cookie you have eaten 90 calories. If you eat all 3 cookies, you have eaten 270 calories (3 servings x 90 calories = 270 calories). The list below tells you how big or small some common portion sizes are.  1 oz.........4 stacked dice.  3 oz.........Deck of cards.  1 tsp........Tip of little finger.  1 tbs........Thumb.  2 tbs........Golf ball.   cup.......Half of a fist.  1 cup........A fist. KEEP A FOOD LOG Write down every food item you eat, the amount you eat, and the number of calories in each food you eat during the day. At the end of the day, you can add up the total number of calories you have eaten. It may help to keep a list like the one below. Find out the calorie information by reading the   Nutrition Facts panel on food labels. Breakfast  Bran cereal (1 cup, 110 calories).  Fat-free milk ( cup, 45 calories). Snack  Apple (1 medium, 80 calories). Lunch  Spinach (1 cup, 20 calories).  Tomato ( medium, 20 calories).  Chicken breast strips (3 oz, 165 calories).  Shredded cheddar cheese ( cup, 110 calories).  Light [place] dressing (2 tbs, 60 calories).  Whole-wheat bread (1 slice, 80 calories).  Tub margarine (1 tsp, 35 calories).  Vegetable soup (1 cup, 160 calories). Dinner  Pork chop (3 oz, 190 calories).  Brown rice (1 cup, 215 calories).  Steamed broccoli ( cup, 20 calories).  Strawberries (1  cup, 65 calories).  Whipped cream (1 tbs, 50  calories). Daily Calorie Total: 1425 Document Released: 07/20/2005 Document Revised: 10/12/2011 Document Reviewed: 01/14/2007 [facility] Patient Information 2013 [place], [place].  Levonorgestrel intrauterine device (IUD) What is this medicine? LEVONORGESTREL IUD (LEE voe nor jes trel) is a contraceptive (birth control) device. The device is placed inside the uterus by a healthcare professional. It is used to prevent pregnancy and can also be used to treat heavy bleeding that occurs during your period. Depending on the device, it can be used for 3 to 5 years. This medicine may be used for other purposes; ask your health care provider or pharmacist if you have questions. What should I tell my health care provider before I take this medicine? They need to know if you have any of these conditions: -abnormal Pap smear -cancer of the breast, uterus, or cervix -diabetes -endometritis -genital or pelvic infection now or in the past -have more than one sexual partner or your partner has more than one partner -heart disease -history of an ectopic or tubal pregnancy -immune system problems -IUD in place -liver disease or tumor -problems with blood clots or take blood-thinners -use intravenous drugs -uterus of unusual shape -vaginal bleeding that has not been explained -an unusual or allergic reaction to levonorgestrel, other hormones, silicone, or polyethylene, medicines, foods, dyes, or preservatives -pregnant or trying to get pregnant -breast-feeding How should I use this medicine? This device is placed inside the uterus by a health care professional. Talk to your pediatrician regarding the use of this medicine in children. Special care may be needed. Overdosage: If you think you have taken too much of this medicine contact a poison control center or emergency room at once. NOTE: This medicine is only for you. Do not share this medicine with others. What if I miss a dose? This does not  apply. What may interact with this medicine? Do not take this medicine with any of the following medications: -amprenavir -bosentan -fosamprenavir This medicine may also interact with the following medications: -aprepitant -barbiturate medicines for inducing sleep or treating seizures -bexarotene -griseofulvin -medicines to treat seizures like carbamazepine, ethotoin, felbamate, oxcarbazepine, phenytoin, topiramate -modafinil -pioglitazone -rifabutin -rifampin -rifapentine -some medicines to treat HIV infection like atazanavir, indinavir, lopinavir, nelfinavir, tipranavir, ritonavir -St. John's wort -warfarin This list may not describe all possible interactions. Give your health care provider a list of all the medicines, herbs, non-prescription drugs, or dietary supplements you use. Also tell them if you smoke, drink alcohol, or use illegal drugs. Some items may interact with your medicine. What should I watch for while using this medicine? Visit your doctor or health care professional for regular check ups. See your doctor if you or your partner has sexual contact with others, becomes HIV positive, or gets a sexual transmitted disease. This product does not protect you against  HIV infection (AIDS) or other sexually transmitted diseases. You can check the placement of the IUD yourself by reaching up to the top of your vagina with clean fingers to feel the threads. Do not pull on the threads. It is a good habit to check placement after each menstrual period. Call your doctor right away if you feel more of the IUD than just the threads or if you cannot feel the threads at all. The IUD may come out by itself. You may become pregnant if the device comes out. If you notice that the IUD has come out use a backup birth control method like condoms and call your health care provider. Using tampons will not change the position of the IUD and are okay to use during your period. What side effects may  I notice from receiving this medicine? Side effects that you should report to your doctor or health care professional as soon as possible: -allergic reactions like skin rash, itching or hives, swelling of the face, lips, or tongue -fever, flu-like symptoms -genital sores -high blood pressure -no menstrual period for 6 weeks during use -pain, swelling, warmth in the leg -pelvic pain or tenderness -severe or sudden headache -signs of pregnancy -stomach cramping -sudden shortness of breath -trouble with balance, talking, or walking -unusual vaginal bleeding, discharge -yellowing of the eyes or skin Side effects that usually do not require medical attention (report to your doctor or health care professional if they continue or are bothersome): -acne -breast pain -change in sex drive or performance -changes in weight -cramping, dizziness, or faintness while the device is being inserted -headache -irregular menstrual bleeding within first 3 to 6 months of use -nausea This list may not describe all possible side effects. Call your doctor for medical advice about side effects. You may report side effects to FDA at 1-800-FDA-1088. Where should I keep my medicine? This does not apply. NOTE: This sheet is a summary. It may not cover all possible information. If you have questions about this medicine, talk to your doctor, pharmacist, or health care provider.  2013, [employer]. (08/20/2011 1:54:04 PM)

## 2012-11-16 ENCOUNTER — Ambulatory Visit (INDEPENDENT_AMBULATORY_CARE_PROVIDER_SITE_OTHER): Payer: BC Managed Care – PPO | Admitting: Obstetrics & Gynecology

## 2012-11-16 ENCOUNTER — Encounter: Payer: Self-pay | Admitting: Obstetrics & Gynecology

## 2012-11-16 VITALS — BP 123/87 | HR 67 | Temp 97.4°F | Ht 67.0 in | Wt 237.5 lb

## 2012-11-16 DIAGNOSIS — Z30431 Encounter for routine checking of intrauterine contraceptive device: Secondary | ICD-10-CM

## 2012-11-16 NOTE — Patient Instructions (Signed)

## 2012-11-16 NOTE — Progress Notes (Signed)
Patient ID: Shelby Duran, female   DOB: October 26, 1970, 42 y.o.   MRN: [id number] History:  42 y.o. G1P1001 here today for today for IUD string check; Mirena IUD was placed  09/2012.  No complaints about the Mirena, no concerning side effects. Does report that her spouse reported feeling 'something.'  She wanted to double check that all was well.   The following portions of the patient's history were reviewed and updated as appropriate: allergies, current medications, past family history, past medical history, past social history, past surgical history and problem list. Last pap smear in Oct 2012 or 2013 and was normal at another facility.  Her last recorded PAP through [facility] was Oct 2011 neg PAP.  Neg HPV.   Review of Systems:  Pertinent items are noted in HPI.  Objective:  Physical Exam Blood pressure 123/87, pulse 67, temperature 97.4 F (36.3 C), temperature source Oral, height 5\' 7"  (1.702 m), weight 237 lb 8 oz (107.729 kg), currently breastfeeding. Gen: NAD Abd: Soft, nontender and nondistended Pelvic: Normal appearing external genitalia; normal appearing vaginal mucosa and cervix.  IUD strings visualized, about 3 cm in length outside cervix.   Assessment & Plan:  Normal IUD check. Patient to keep IUD in place for five years; can come in for removal if she desires pregnancy within the next five years. Routine preventative health maintenance measures emphasized.

## 2013-02-17 ENCOUNTER — Ambulatory Visit (INDEPENDENT_AMBULATORY_CARE_PROVIDER_SITE_OTHER): Payer: BC Managed Care – PPO | Admitting: Family Medicine

## 2013-02-17 ENCOUNTER — Encounter: Payer: Self-pay | Admitting: Family Medicine

## 2013-02-17 VITALS — BP 117/76 | HR 76 | Ht 67.5 in | Wt 238.0 lb

## 2013-02-17 DIAGNOSIS — E669 Obesity, unspecified: Secondary | ICD-10-CM

## 2013-02-17 NOTE — Progress Notes (Signed)
  Subjective:    Patient ID: Shelby Duran, female    DOB: 01-23-1971, 41 y.o.   MRN: [id number]  HPI  Martie Lee is here today to begin the Step By Step Diet & Fitness Program.  She has tried numerous other diet programs and has not been successful with them.  She feels that she needs to lose weight to improve her general health. She did well on HCG several years ago and would like to do the program again.    Review of Systems  Constitutional: Positive for appetite change, fatigue and unexpected weight change. Negative for activity change.       Weight gain   HENT: Negative.   Eyes: Negative.   Respiratory: Negative for chest tightness and shortness of breath.   Cardiovascular: Negative for chest pain, palpitations and leg swelling.  Endocrine: Negative for polydipsia, polyphagia and polyuria.  Genitourinary: Negative.   Neurological: Negative.   Psychiatric/Behavioral: Negative.     Past Medical History  Diagnosis Date  . Unspecified vitamin D deficiency   . Allergy     Family History  Problem Relation Age of Onset  . Hypertension Mother   . Kidney disease Mother   . Asthma Father   . COPD Father   . Kidney disease Sister   . Multiple sclerosis Sister   . 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 No distress.  HENT:  Head: Normocephalic.  Eyes: No scleral icterus.  Neck: No thyromegaly present.  Cardiovascular: Normal rate, regular rhythm and normal heart sounds.   Pulmonary/Chest: Effort normal and  breath sounds normal.  Abdominal: There is no tenderness.  Musculoskeletal: She exhibits no edema and no tenderness.  Neurological: She is alert.  Skin: Skin is warm and dry.  Psychiatric: She has a normal mood and affect. Her behavior is normal. Judgment and thought content normal.          Assessment & Plan:

## 2013-03-02 ENCOUNTER — Ambulatory Visit: Payer: BC Managed Care – PPO | Admitting: Family Medicine

## 2013-03-02 ENCOUNTER — Ambulatory Visit (INDEPENDENT_AMBULATORY_CARE_PROVIDER_SITE_OTHER): Payer: BC Managed Care – PPO | Admitting: Family Medicine

## 2013-03-02 ENCOUNTER — Encounter: Payer: Self-pay | Admitting: Family Medicine

## 2013-03-02 VITALS — BP 101/69 | HR 76 | Wt 226.0 lb

## 2013-03-02 DIAGNOSIS — E669 Obesity, unspecified: Secondary | ICD-10-CM | POA: Insufficient documentation

## 2013-03-02 MED ORDER — CHORIONIC GONADOTROPIN 10000 UNITS IM SOLR: INTRAMUSCULAR | Status: DC

## 2013-03-02 MED ORDER — PHENDIMETRAZINE TARTRATE 35 MG PO TABS: 1.0000 | ORAL_TABLET | Freq: Three times a day (TID) | ORAL | Status: DC

## 2013-03-02 MED ORDER — CYANOCOBALAMIN 1000 MCG/ML IJ SOLN: INTRAMUSCULAR | Status: AC

## 2013-03-02 NOTE — Assessment & Plan Note (Signed)
The patient is going to begin the "Step By Step" program.  They will take daily IM injections of hCG and will follow the 500 calorie diet as illustrated in Dr. Simeons' "Pounds & Inches".  They will also take Phendimetrazine to suppress appetite. They were also instructed to get at least one hour of exercise daily. 

## 2013-03-02 NOTE — Progress Notes (Signed)
  Subjective:    Patient ID: Shelby Duran, female    DOB: 1971-05-10, 42 y.o.   MRN: [id number]  HPI  Shelby Duran is here today for a follow up of her weight loss. She has just completed her 2nd  week of the "Step By Step"  Program.  She is taking Phendimetrazine without any problem. She has also been receiving HCG injections without difficulty.  She has been following the 500 calorie diet and has lost 6 lbs since her last visit.  Her energy has improved.     Review of Systems  Constitutional: Negative for fatigue.  Neurological: Negative for light-headedness.    Past Medical History  Diagnosis Date  . Unspecified vitamin D deficiency   . Allergy     Family History  Problem Relation Age of Onset  . Hypertension Mother   . Kidney disease Mother   . Asthma Father   . COPD Father   . Kidney disease Sister   . Multiple sclerosis Sister   . 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 Constitutional: She is oriented to person, place, and time.  Eyes: Conjunctivae are normal. No scleral icterus.  Neck: Neck supple. No thyromegaly present.  Cardiovascular: Normal rate, regular rhythm and normal heart sounds.   Pulmonary/Chest: Effort normal and breath sounds normal.  Musculoskeletal: She exhibits no edema and no tenderness.  Lymphadenopathy:    She has no cervical adenopathy.  Neurological: She is alert and oriented to person, place, and time.  Skin: Skin is warm and dry.  Psychiatric: She has a normal mood and affect. Her behavior is normal.  Judgment and thought content normal.      Assessment & Plan:

## 2013-03-03 ENCOUNTER — Encounter: Payer: BC Managed Care – PPO | Admitting: Family Medicine

## 2013-03-09 ENCOUNTER — Ambulatory Visit: Payer: BC Managed Care – PPO | Admitting: *Deleted

## 2013-03-09 VITALS — Wt 223.0 lb

## 2013-03-16 ENCOUNTER — Ambulatory Visit (INDEPENDENT_AMBULATORY_CARE_PROVIDER_SITE_OTHER): Payer: BC Managed Care – PPO | Admitting: Family Medicine

## 2013-03-16 VITALS — BP 111/78 | HR 79 | Resp 16 | Ht 68.0 in | Wt 217.0 lb

## 2013-03-16 DIAGNOSIS — E669 Obesity, unspecified: Secondary | ICD-10-CM

## 2013-03-16 MED ORDER — PHENTERMINE HCL 37.5 MG PO TABS: 37.5000 mg | ORAL_TABLET | Freq: Every day | ORAL | Status: DC

## 2013-03-16 NOTE — Patient Instructions (Addendum)

## 2013-03-16 NOTE — Progress Notes (Signed)
  Subjective:    Patient ID: Shelby Duran, female    DOB: 1971/01/15, 42 y.o.   MRN: [id number]  HPI  Shelby Duran is here today for a follow up of her weight loss. She has just completed her 4th week of the "Step By Step"  Program.  She has been taking Phendimetrazine without any problem. She has also been receiving HCG injections without difficulty.  She has been following the 500 calorie diet and has lost 21 lbs since she began her plan. She is very happy with her weight loss and feels great.     Review of Systems  Constitutional: Negative for appetite change.  Neurological: Negative for light-headedness.    Past Medical History  Diagnosis Date  . Unspecified vitamin D deficiency   . Allergy     Family History  Problem Relation Age of Onset  . Hypertension Mother   . Kidney disease Mother   . Asthma Father   . COPD Father   . Kidney disease Sister   . Multiple sclerosis Sister   . 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 Constitutional: She is oriented to person, place, and time.  Eyes: Conjunctivae are normal. No scleral icterus.  Neck: Neck supple. No thyromegaly present.  Cardiovascular: Normal rate, regular rhythm and normal heart sounds.   Pulmonary/Chest: Effort normal and breath sounds normal.  Musculoskeletal: She exhibits no edema and no tenderness.  Lymphadenopathy:    She has no cervical adenopathy.  Neurological: She is alert and oriented to person, place, and time.  Skin: Skin is warm and dry.  Psychiatric: She has a  normal mood and affect. Her behavior is normal. Judgment and thought content normal.          Assessment & Plan:

## 2013-04-16 NOTE — Assessment & Plan Note (Signed)
She is doing well on the HCG diet.  She will continue for the next 2 weeks.

## 2013-04-30 ENCOUNTER — Encounter: Payer: Self-pay | Admitting: Family Medicine

## 2013-04-30 NOTE — Assessment & Plan Note (Signed)
She has done well on Phase II of the HCG diet. She is ready to move on to Phase III where she will limit her intake of starch and sugar and calories to 1000.    

## 2013-06-08 ENCOUNTER — Other Ambulatory Visit: Payer: Self-pay

## 2013-06-16 ENCOUNTER — Other Ambulatory Visit: Payer: Self-pay | Admitting: Family Medicine

## 2013-06-16 DIAGNOSIS — Z1231 Encounter for screening mammogram for malignant neoplasm of breast: Secondary | ICD-10-CM

## 2013-06-22 ENCOUNTER — Other Ambulatory Visit: Payer: Self-pay | Admitting: Family Medicine

## 2013-06-22 ENCOUNTER — Other Ambulatory Visit (HOSPITAL_COMMUNITY)
Admission: RE | Admit: 2013-06-22 | Discharge: 2013-06-22 | Disposition: A | Discharge status: Home / Self Care | Payer: BC Managed Care – PPO | Source: Ambulatory Visit | Attending: Family Medicine | Admitting: Family Medicine

## 2013-06-22 DIAGNOSIS — Z0000 Encounter for general adult medical examination without abnormal findings: Secondary | ICD-10-CM | POA: Insufficient documentation

## 2013-07-05 ENCOUNTER — Ambulatory Visit
Admission: RE | Admit: 2013-07-05 | Discharge: 2013-07-05 | Disposition: A | Discharge status: Home / Self Care | Payer: BC Managed Care – PPO | Source: Ambulatory Visit | Attending: Family Medicine | Admitting: Family Medicine

## 2013-07-05 DIAGNOSIS — Z1231 Encounter for screening mammogram for malignant neoplasm of breast: Secondary | ICD-10-CM

## 2014-05-30 ENCOUNTER — Other Ambulatory Visit: Payer: Self-pay

## 2014-05-30 DIAGNOSIS — Z1231 Encounter for screening mammogram for malignant neoplasm of breast: Secondary | ICD-10-CM

## 2014-06-04 ENCOUNTER — Encounter: Payer: Self-pay | Admitting: Family Medicine

## 2014-07-06 ENCOUNTER — Ambulatory Visit
Admission: RE | Admit: 2014-07-06 | Discharge: 2014-07-06 | Disposition: A | Discharge status: Home / Self Care | Payer: BC Managed Care – PPO | Source: Ambulatory Visit

## 2014-07-06 DIAGNOSIS — Z1231 Encounter for screening mammogram for malignant neoplasm of breast: Secondary | ICD-10-CM

## 2014-10-03 ENCOUNTER — Encounter: Payer: Self-pay | Admitting: Obstetrics & Gynecology

## 2014-10-03 ENCOUNTER — Ambulatory Visit (INDEPENDENT_AMBULATORY_CARE_PROVIDER_SITE_OTHER): Payer: BC Managed Care – PPO | Admitting: Obstetrics & Gynecology

## 2014-10-03 VITALS — BP 128/81 | HR 67 | Temp 97.9°F | Ht 67.0 in | Wt 242.4 lb

## 2014-10-03 DIAGNOSIS — Z1151 Encounter for screening for human papillomavirus (HPV): Secondary | ICD-10-CM

## 2014-10-03 DIAGNOSIS — Z124 Encounter for screening for malignant neoplasm of cervix: Secondary | ICD-10-CM

## 2014-10-03 DIAGNOSIS — Z01419 Encounter for gynecological examination (general) (routine) without abnormal findings: Secondary | ICD-10-CM | POA: Diagnosis not present

## 2014-10-03 NOTE — Patient Instructions (Signed)
Calorie Counting for Weight Loss Calories are energy you get from the things you eat and drink. Your body uses this energy to keep you going throughout the day. The number of calories you eat affects your weight. When you eat more calories than your body needs, your body stores the extra calories as fat. When you eat fewer calories than your body needs, your body burns fat to get the energy it needs. Calorie counting means keeping track of how many calories you eat and drink each day. If you make sure to eat fewer calories than your body needs, you should lose weight. In order for calorie counting to work, you will need to eat the number of calories that are right for you in a day to lose a healthy amount of weight per week. A healthy amount of weight to lose per week is usually 1-2 lb (0.5-0.9 kg). A dietitian can determine how many calories you need in a day and give you suggestions on how to reach your calorie goal.  WHAT IS MY MY PLAN? My goal is to have __________ calories per day.  If I have this many calories per day, I should lose around __________ pounds per week. WHAT DO I NEED TO KNOW ABOUT CALORIE COUNTING? In order to meet your daily calorie goal, you will need to:  Find out how many calories are in each food you would like to eat. Try to do this before you eat.  Decide how much of the food you can eat.  Write down what you ate and how many calories it had. Doing this is called keeping a food log. WHERE DO I FIND CALORIE INFORMATION? The number of calories in a food can be found on a Nutrition Facts label. Note that all the information on a label is based on a specific serving of the food. If a food does not have a Nutrition Facts label, try to look up the calories online or ask your dietitian for help. HOW DO I DECIDE HOW MUCH TO EAT? To decide how much of the food you can eat, you will need to consider both the number of calories in one serving and the size of one serving. This  information can be found on the Nutrition Facts label. If a food does not have a Nutrition Facts label, look up the information online or ask your dietitian for help. Remember that calories are listed per serving. If you choose to have more than one serving of a food, you will have to multiply the calories per serving by the amount of servings you plan to eat. For example, the label on a package of bread might say that a serving size is 1 slice and that there are 90 calories in a serving. If you eat 1 slice, you will have eaten 90 calories. If you eat 2 slices, you will have eaten 180 calories. HOW DO I KEEP A FOOD LOG? After each meal, record the following information in your food log:  What you ate.  How much of it you ate.  How many calories it had.  Then, add up your calories. Keep your food log near you, such as in a small notebook in your pocket. Another option is to use a mobile app or website. Some programs will calculate calories for you and show you how many calories you have left each time you add an item to the log. WHAT ARE SOME CALORIE COUNTING TIPS?  Use your calories on foods   and drinks that will fill you up and not leave you hungry. Some examples of this include foods like nuts and nut butters, vegetables, lean proteins, and high-fiber foods (more than 5 g fiber per serving).  Eat nutritious foods and avoid empty calories. Empty calories are calories you get from foods or beverages that do not have many nutrients, such as candy and soda. It is better to have a nutritious high-calorie food (such as an avocado) than a food with few nutrients (such as a bag of chips).  Know how many calories are in the foods you eat most often. This way, you do not have to look up how many calories they have each time you eat them.  Look out for foods that may seem like low-calorie foods but are really high-calorie foods, such as baked goods, soda, and fat-free candy.  Pay attention to calories  in drinks. Drinks such as sodas, specialty coffee drinks, alcohol, and juices have a lot of calories yet do not fill you up. Choose low-calorie drinks like water and diet drinks.  Focus your calorie counting efforts on higher calorie items. Logging the calories in a garden salad that contains only vegetables is less important than calculating the calories in a milk shake.  Find a way of tracking calories that works for you. Get creative. Most people who are successful find ways to keep track of how much they eat in a day, even if they do not count every calorie. WHAT ARE SOME PORTION CONTROL TIPS?  Know how many calories are in a serving. This will help you know how many servings of a certain food you can have.  Use a measuring cup to measure serving sizes. This is helpful when you start out. With time, you will be able to estimate serving sizes for some foods.  Take some time to put servings of different foods on your favorite plates, bowls, and cups so you know what a serving looks like.  Try not to eat straight from a bag or box. Doing this can lead to overeating. Put the amount you would like to eat in a cup or on a plate to make sure you are eating the right portion.  Use smaller plates, glasses, and bowls to prevent overeating. This is a quick and easy way to practice portion control. If your plate is smaller, less food can fit on it.  Try not to multitask while eating, such as watching TV or using your computer. If it is time to eat, sit down at a table and enjoy your food. Doing this will help you to start recognizing when you are full. It will also make you more aware of what and how much you are eating. HOW CAN I CALORIE COUNT WHEN EATING OUT?  Ask for smaller portion sizes or child-sized portions.  Consider sharing an entree and sides instead of getting your own entree.  If you get your own entree, eat only half. Ask for a box at the beginning of your meal and put the rest of your  entree in it so you are not tempted to eat it.  Look for the calories on the menu. If calories are listed, choose the lower calorie options.  Choose dishes that include vegetables, fruits, whole grains, low-fat dairy products, and lean protein. Focusing on smart food choices from each of the 5 food groups can help you stay on track at restaurants.  Choose items that are boiled, broiled, grilled, or steamed.  Choose   water, milk, unsweetened iced tea, or other drinks without added sugars. If you want an alcoholic beverage, choose a lower calorie option. For example, a regular margarita can have up to 700 calories and a glass of wine has around 150.  Stay away from items that are buttered, battered, fried, or served with cream sauce. Items labeled "crispy" are usually fried, unless stated otherwise.  Ask for dressings, sauces, and syrups on the side. These are usually very high in calories, so do not eat much of them.  Watch out for salads. Many people think salads are a healthy option, but this is often not the case. Many salads come with bacon, fried chicken, lots of cheese, fried chips, and dressing. All of these items have a lot of calories. If you want a salad, choose a garden salad and ask for grilled meats or steak. Ask for the dressing on the side, or ask for olive oil and vinegar or lemon to use as dressing.  Estimate how many servings of a food you are given. For example, a serving of cooked rice is  cup or about the size of half a tennis ball or one cupcake wrapper. Knowing serving sizes will help you be aware of how much food you are eating at restaurants. The list below tells you how big or small some common portion sizes are based on everyday objects.  1 oz--4 stacked dice.  3 oz--1 deck of cards.  1 tsp--1 dice.  1 Tbsp-- a Ping-Pong ball.  2 Tbsp--1 Ping-Pong ball.   cup--1 tennis ball or 1 cupcake wrapper.  1 cup--1 baseball. Document Released: 07/20/2005 Document  Revised: 12/04/2013 Document Reviewed: 05/25/2013 Exit[employer] Patient Information 2015 [employer]. This information is not intended to replace advice given to you by your health care provider. Make sure you discuss any questions you have with your health care provider.   Exercise to Lose Weight Exercise and a healthy diet may help you lose weight. Your doctor may suggest specific exercises. EXERCISE IDEAS AND TIPS  Choose low-cost things you enjoy doing, such as walking, bicycling, or exercising to workout videos.  Take stairs instead of the elevator.  Walk during your lunch break.  Park your car further away from work or school.  Go to a gym or an exercise class.  Start with 5 to 10 minutes of exercise each day. Build up to 30 minutes of exercise 4 to 6 days a week.  Wear shoes with good support and comfortable clothes.  Stretch before and after working out.  Work out until you breathe harder and your heart beats faster.  Drink extra water when you exercise.  Do not do so much that you hurt yourself, feel dizzy, or get very short of breath. Exercises that burn about 150 calories:  Running 1  miles in 15 minutes.  Playing volleyball for 45 to 60 minutes.  Washing and waxing a car for 45 to 60 minutes.  Playing touch football for 45 minutes.  Walking 1  miles in 35 minutes.  Pushing a stroller 1  miles in 30 minutes.  Playing basketball for 30 minutes.  Raking leaves for 30 minutes.  Bicycling 5 miles in 30 minutes.  Walking 2 miles in 30 minutes.  Dancing for 30 minutes.  Shoveling snow for 15 minutes.  Swimming laps for 20 minutes.  Walking up stairs for 15 minutes.  Bicycling 4 miles in 15 minutes.  Gardening for 30 to 45 minutes.  Jumping rope for 15 minutes.    Washing windows or floors for 45 to 60 minutes. Document Released: 08/22/2010 Document Revised: 10/12/2011 Document Reviewed: 08/22/2010 Exit[employer] Patient Information 2015 [employer]. This information is not intended to replace advice given to you by your health care provider. Make sure you discuss any questions you have with your health care provider.  

## 2014-10-03 NOTE — Progress Notes (Signed)
Patient ID: Shelby Duran, female   DOB: 1970/09/07, 44 y.o.   MRN: [id number] Subjective:     Shelby Duran is a 44 y.o. female here for a routine exam.  Current complaints: Pt is worried about lack of weight loss despite regular cardio.  She is considering a gastric bypass.  No GYN problems or complaints. Wants to confirm that her IUD is in place.  She has virtual amenorrhea since the device was placed.  She just want to insure that she will not conceive- had questions about permanent sterilization.      Gynecologic History No LMP recorded. Patient is not currently having periods (Reason: IUD). Contraception: IUD Last Pap: 08/2012. Results were: normal Last mammogram: 07/2014. Results were: normal  Obstetric History OB History  Gravida Para Term Preterm AB SAB TAB Ectopic Multiple Living  1 1 1  0 0 0 0 0 0 1    # Outcome Date GA Lbr Len/2nd Weight Sex Delivery Anes PTL Lv  1 Term 06/08/12 [id number] 13:37 / 01:08 6 lb 0.5 oz (2.735 kg) M Vag-Spont None  Y     Comments: No anomalies, significant caput noted/ occiput posterior presentation, swelling noted around eyes       The following portions of the patient's history were reviewed and updated as appropriate: allergies, current medications, past family history, past medical history, past social history, past surgical history and problem list.  Review of Systems Pertinent items are noted in HPI.    Objective:    BP 128/81 mmHg  Pulse 67  Temp(Src) 97.9 F (36.6 C)  Ht 5\' 7"  (1.702 m)  Wt 242 lb 6.4 oz (109.952 kg)  BMI 37.96 kg/m2  General Appearance:    Alert, cooperative, no distress, appears stated age  Head:    Normocephalic, without obvious abnormality, atraumatic           Throat:   Lips, mucosa, and tongue normal; teeth and gums normal  Neck:   Supple, symmetrical, trachea midline, no adenopathy;    thyroid:  no enlargement/tenderness/nodules; no carotid   bruit or JVD  Back:     Symmetric, no curvature, ROM  normal, no CVA tenderness  Lungs:     Clear to auscultation bilaterally, respirations unlabored  Chest Wall:    No tenderness or deformity   Heart:    Regular rate and rhythm, S1 and S2 normal, no murmur, rub   or gallop  Breast Exam:    No tenderness, masses, or nipple abnormality  Abdomen:     Soft, non-tender, bowel sounds active all four quadrants,    no masses, no organomegaly  Genitalia:    Normal female without lesion, discharge or tenderness GU: EGBUS: no lesions Vagina: no blood in vault Cervix: no lesion; no mucopurulent d/c; IUD strings noted Uterus: small, mobile Adnexa: no masses; non tender   Rectal:    Not done  Extremities:   Extremities normal, atraumatic, no cyanosis or edema  Pulses:   2+ and symmetric all extremities  Skin:   Skin color, texture, turgor normal, no rashes or lesions  Lymph nodes:   Cervical, supraclavicular, and axillary nodes normal         Assessment:    Healthy female exam.   Contraception counseling. Reviewed LnIUD vs Lap salpingectomy/ BTL.  She will keep the IUD for cycle control Weight management- discussed with pt lifting weights and diet management- rec Weight [employer]chers.   Plan:    Follow up in: 1 year.    F/u  PAP

## 2014-10-04 ENCOUNTER — Encounter: Payer: Self-pay | Admitting: Obstetrics & Gynecology

## 2014-10-04 LAB — CYTOLOGY - PAP

## 2015-06-18 ENCOUNTER — Other Ambulatory Visit: Payer: Self-pay

## 2015-06-18 DIAGNOSIS — Z9889 Other specified postprocedural states: Secondary | ICD-10-CM

## 2015-06-18 DIAGNOSIS — Z1231 Encounter for screening mammogram for malignant neoplasm of breast: Secondary | ICD-10-CM

## 2015-07-11 ENCOUNTER — Ambulatory Visit: Payer: BC Managed Care – PPO

## 2015-08-01 ENCOUNTER — Ambulatory Visit
Admission: RE | Admit: 2015-08-01 | Discharge: 2015-08-01 | Disposition: A | Discharge status: Home / Self Care | Payer: BC Managed Care – PPO | Source: Ambulatory Visit

## 2015-08-01 DIAGNOSIS — Z1231 Encounter for screening mammogram for malignant neoplasm of breast: Secondary | ICD-10-CM

## 2015-08-01 DIAGNOSIS — Z9889 Other specified postprocedural states: Secondary | ICD-10-CM

## 2016-06-23 ENCOUNTER — Other Ambulatory Visit: Payer: Self-pay | Admitting: Family Medicine

## 2016-06-23 DIAGNOSIS — Z1231 Encounter for screening mammogram for malignant neoplasm of breast: Secondary | ICD-10-CM

## 2016-07-31 ENCOUNTER — Ambulatory Visit
Admission: RE | Admit: 2016-07-31 | Discharge: 2016-07-31 | Disposition: A | Discharge status: Home / Self Care | Payer: BC Managed Care – PPO | Source: Ambulatory Visit | Attending: Cardiology | Admitting: Cardiology

## 2016-07-31 DIAGNOSIS — Z1231 Encounter for screening mammogram for malignant neoplasm of breast: Secondary | ICD-10-CM

## 2016-10-11 ENCOUNTER — Emergency Department (HOSPITAL_COMMUNITY)
Admission: EM | Admit: 2016-10-11 | Discharge: 2016-10-11 | Disposition: A | Discharge status: Home / Self Care | Payer: BC Managed Care – PPO | Attending: Emergency Medicine | Admitting: Emergency Medicine

## 2016-10-11 ENCOUNTER — Encounter (HOSPITAL_COMMUNITY): Payer: Self-pay

## 2016-10-11 DIAGNOSIS — J36 Peritonsillar abscess: Secondary | ICD-10-CM | POA: Diagnosis not present

## 2016-10-11 DIAGNOSIS — J029 Acute pharyngitis, unspecified: Secondary | ICD-10-CM | POA: Diagnosis present

## 2016-10-11 LAB — POC URINE PREG, ED: Preg Test, Ur: NEGATIVE

## 2016-10-11 MED ORDER — DEXAMETHASONE SODIUM PHOSPHATE 10 MG/ML IJ SOLN
10.0000 mg | Freq: Once | INTRAMUSCULAR | Status: AC
  Administered 2016-10-11: 10 mg via INTRAMUSCULAR
  Filled 2016-10-11: qty 1

## 2016-10-11 MED ORDER — CLINDAMYCIN HCL 300 MG PO CAPS: 600.0000 mg | ORAL_CAPSULE | Freq: Four times a day (QID) | ORAL | 0 refills | Status: DC

## 2016-10-11 MED ORDER — IBUPROFEN 600 MG PO TABS: 600.0000 mg | ORAL_TABLET | Freq: Four times a day (QID) | ORAL | 0 refills | Status: AC | PRN

## 2016-10-11 NOTE — Procedures (Signed)
Preop diagnosis: Left peritonsillar abscess Postop diagnosis: same Procedure: Incision and drainage of left peritonsillar abscess Surgeon: Jenne PaneBates Anesth: Topical with cetacaine and local with 1% lidocaine with 1:100,000 epinephrine Compl: None Findings: No pus was encountered. Description:  After discussing risks, benefits, and alternatives, the patient was placed in a seated position and the left oropharynx was sprayed with cetacaine.  The peritonsillar area was then injected with local anesthetic.  An angled incision was made above the left tonsil using a 15 blade scalpel.  The peritonsillar plane was then explored with a tonsil clamp and then with an 18 gauge needle on a 10 cc syringe.  No pus was encountered.  She tolerated the procedure and was returned to nursing care in stable condition.

## 2016-10-11 NOTE — Discharge Instructions (Signed)
Take antibiotics as prescribed. Follow-up with Dr. Jenne PaneBates at the scheduled appointment. Return to the ED if he develop any worsening symptoms. Motrin and Tylenol for pain.

## 2016-10-11 NOTE — Consult Note (Signed)
Reason for Consult: Left peritonsillar abscess Referring Physician: ER  Shelby Duran is an 46 y.o. female.  HPI: 46 year old female with two days of worsening left-sided throat pain and swelling with radiation to the left ear.  Swallowing is difficult due to pain.  She is drinking liquids.  Came to ER and was given IV steroid and antibiotic and feels a bit better.  Past Medical History:  Diagnosis Date  . Allergy   . Unspecified vitamin D deficiency     Past Surgical History:  Procedure Laterality Date  . BREAST REDUCTION SURGERY    . WISDOM TOOTH EXTRACTION      Family History  Problem Relation Age of Onset  . Hypertension Mother   . Kidney disease Mother   . Asthma Father   . COPD Father   . Kidney disease Sister   . Multiple sclerosis Sister   . Asthma Brother     Social History:  reports that she has never smoked. She has never used smokeless tobacco. She reports that she drinks alcohol. She reports that she does not use drugs.  Allergies: No Known Allergies  Medications: I have reviewed the patient's current medications.  Results for orders placed or performed during the hospital encounter of 10/11/16 (from the past 48 hour(s))  POC urine preg, ED     Status: None   Collection Time: 10/11/16 11:26 AM  Result Value Ref Range   Preg Test, Ur NEGATIVE NEGATIVE    Comment:        THE SENSITIVITY OF THIS METHODOLOGY IS >24 mIU/mL     No results found.  Review of Systems  HENT: Positive for ear pain and sore throat.   All other systems reviewed and are negative.  Blood pressure 141/86, pulse 78, temperature 98.7 F (37.1 C), temperature source Oral, resp. rate 20, SpO2 98 %. Physical Exam  Constitutional: She is oriented to person, place, and time. She appears well-developed and well-nourished. No distress.  HENT:  Head: Normocephalic and atraumatic.  Right Ear: External ear normal.  Left Ear: External ear normal.  Nose: Nose normal.  Mild trismus.   Voice mildly muffled.  Some left peritonsillar fullness with uvula deviated slightly to right.  Eyes: Conjunctivae and EOM are normal. Pupils are equal, round, and reactive to light.  Neck: Normal range of motion. Neck supple.  Tender left zone 2.  Cardiovascular: Normal rate.   Respiratory: Effort normal.  Musculoskeletal: Normal range of motion.  Neurological: She is alert and oriented to person, place, and time. No cranial nerve deficit.  Skin: Skin is warm and dry.  Psychiatric: She has a normal mood and affect. Her behavior is normal. Judgment and thought content normal.    Assessment/Plan: Left peritonsillar abscess Incision and drainage was performed at the bedside.  See procedure note.  I did not encounter pus.  She will be discharged on oral clindamycin and ibuprofen.  She will follow-up later in the week.  Shelby Duran 10/11/2016, 2:32 PM

## 2016-10-11 NOTE — ED Triage Notes (Signed)
Patient complains of sore throat since Friday. [facility]en at [facility] this am with negative strep and wants second opinion and antibiotic

## 2016-10-11 NOTE — ED Notes (Signed)
Declined W/C at D/C and was escorted to lobby by RN. 

## 2016-10-11 NOTE — ED Provider Notes (Signed)
[facility]-EMERGENCY DEPT Provider Note   CSN: [id number] Arrival date & time: 10/11/16  4098  By signing my name below, I, Shelby Duran, attest that this documentation has been prepared under the direction and in the presence of Demetrios Loll, PA-C. Electronically Signed: Freida Duran, Scribe. 10/11/2016. 11:07 AM.  History   Chief Complaint Chief Complaint  Patient presents with  . Sore Throat   The history is provided by the patient. No language interpreter was used.     HPI Comments:  Shelby Duran is a 46 y.o. female who presents to the Emergency Department complaining of gradually worsening sore throat x 3 days with pain to the left side. She is able to swallow her secretions but reports increased pain when doing so.  She has taken Tylenol and Advil with minimal relief. She denies difficulty breathing and fever. [facility], [facility], and was told she had a PTA. She was sent to the ED for I&D, per pt.    Past Medical History:  Diagnosis Date  . Allergy   . Unspecified vitamin D deficiency     Patient Active Problem List   Diagnosis Date Noted  . Obesity, unspecified 03/02/2013  . Refusal of blood transfusions as patient is Jehovah's Witness 03/09/2012    Past Surgical History:  Procedure Laterality Date  . BREAST REDUCTION SURGERY    . WISDOM TOOTH EXTRACTION      OB History    Gravida Para Term Preterm AB Living   1 1 1  0 0 1   SAB TAB Ectopic Multiple Live Births   0 0 0 0 1       Home Medications    Prior to Admission medications   Medication Sig Start Date End Date Taking? Authorizing Provider  levonorgestrel (MIRENA) 20 MCG/24HR IUD 1 each by Intrauterine route once.    Historical Provider, MD    Family History Family History  Problem Relation Age of Onset  . Hypertension Mother   . Kidney disease Mother   . Asthma Father   . COPD Father   . Kidney disease Sister   . Multiple sclerosis Sister   . Asthma  Brother     Social History Social History  Substance Use Topics  . Smoking status: Never Smoker  . Smokeless tobacco: Never Used  . Alcohol use Yes     Comment: occasional      Allergies   Patient has no known allergies.   Review of Systems Review of Systems  Constitutional: Negative for chills and fever.  HENT: Positive for sore throat. Negative for trouble swallowing.   Respiratory: Negative for shortness of breath.   Cardiovascular: Negative for chest pain.  Gastrointestinal: Negative for nausea and vomiting.     Physical Exam Updated Vital Signs BP 141/86 (BP Location: Left Arm)   Pulse 78   Temp 98.7 F (37.1 C) (Oral)   Resp 20   SpO2 98%   Physical Exam  Constitutional: She is oriented to person, place, and time. She appears well-developed and well-nourished. No distress.  HENT:  Head: Normocephalic and atraumatic.  Uvula deviated to the right with peritonsillar swelling and erythema consistent with pta. Oropharynx is patent  Speaking in complete sentences, managing secretions and maintaining airway Slight hoarse voice  Eyes: Conjunctivae are normal.  Neck:  Left cervical adenopathy   Cardiovascular: Normal rate.   Pulmonary/Chest: Effort normal and breath sounds normal.  Abdominal: She exhibits no distension.  Lymphadenopathy:  She has cervical adenopathy (cervical).  Neurological: She is alert and oriented to person, place, and time.  Skin: Skin is warm and dry.  Psychiatric: She has a normal mood and affect.  Nursing note and vitals reviewed.    ED Treatments / Results  DIAGNOSTIC STUDIES:  Oxygen Saturation is 98% on RA, normal by my interpretation.    COORDINATION OF CARE:  11:01 AM Discussed treatment plan with pt at bedside and pt agreed to plan.  Labs (all labs ordered are listed, but only abnormal results are displayed) Labs Reviewed  POC URINE PREG, ED    EKG  EKG Interpretation None       Radiology No results  found.  Procedures Procedures (including critical care time)  Medications Ordered in ED Medications  dexamethasone (DECADRON) injection 10 mg (10 mg Intramuscular Given 10/11/16 1113)     Initial Impression / Assessment and Plan / ED Course  I have reviewed the triage vital signs and the nursing notes.  Pertinent labs & imaging results that were available during my care of the patient were reviewed by me and considered in my medical decision making (see chart for details).    Pt presents with sore throat, trismus, and uvula deviation.  Presentation is concerning for peritonsillar abscess; patent airway. Do not feel that imaging is needed.  Pt with negative rapid strep test from Urgent Care. Pt is tolerating secretions.  Spoke with Dr. Jenne PaneBates with ENT who will come to the ED to drain abscess. Please see consult and procedure note. Pt given decadron IM while in ED. Dr. Jenne PaneBates requested that I start pt on clindamycin 600 QID and motrin. Will follow up in there office on Thursday. Dicussed strict return precautions.  PT verbalized understanding with plan of care. Vs stable. NAD after drainage. All questions answered prior to dc.     Final Clinical Impressions(s) / ED Diagnoses   Final diagnoses:  Peritonsillar abscess    New Prescriptions Discharge Medication List as of 10/11/2016  2:44 PM    START taking these medications   Details  clindamycin (CLEOCIN) 300 MG capsule Take 2 capsules (600 mg total) by mouth 4 (four) times daily., Starting Sun 10/11/2016, Print    ibuprofen (ADVIL,MOTRIN) 600 MG tablet Take 1 tablet (600 mg total) by mouth every 6 (six) hours as needed., Starting Sun 10/11/2016, Print      I personally performed the services described in this documentation, which was scribed in my presence. The recorded information has been reviewed and is accurate.     Rise MuKenneth T Evalette Montrose, PA-C 10/11/16 2025    Lorre NickAnthony Allen, MD 10/14/16 [phone]

## 2017-02-04 ENCOUNTER — Encounter: Payer: Self-pay | Admitting: Obstetrics & Gynecology

## 2017-02-04 ENCOUNTER — Ambulatory Visit (INDEPENDENT_AMBULATORY_CARE_PROVIDER_SITE_OTHER): Payer: BC Managed Care – PPO | Admitting: Obstetrics & Gynecology

## 2017-02-04 ENCOUNTER — Encounter: Payer: Self-pay | Admitting: Family Medicine

## 2017-02-04 VITALS — BP 131/71 | HR 76 | Wt 245.0 lb

## 2017-02-04 DIAGNOSIS — Z1151 Encounter for screening for human papillomavirus (HPV): Secondary | ICD-10-CM | POA: Diagnosis not present

## 2017-02-04 DIAGNOSIS — Z124 Encounter for screening for malignant neoplasm of cervix: Secondary | ICD-10-CM

## 2017-02-04 DIAGNOSIS — Z01419 Encounter for gynecological examination (general) (routine) without abnormal findings: Secondary | ICD-10-CM

## 2017-02-04 DIAGNOSIS — Z3009 Encounter for other general counseling and advice on contraception: Secondary | ICD-10-CM

## 2017-02-04 NOTE — Progress Notes (Signed)
Subjective:     Shelby Duran is a 46 y.o. female here for a routine exam.  Current complaints: none. Pt wants to consider sterilization.  She enjoys very light menses with the LnIUD         Gynecologic History No LMP recorded. Patient is not currently having periods (Reason: IUD). Contraception: IUD Last Pap: 10/03/2014. Results were: normal Last mammogram: 07/31/2016. Results were: normal  Obstetric History OB History  Gravida Para Term Preterm AB Living  1 1 1  0 0 1  SAB TAB Ectopic Multiple Live Births  0 0 0 0 1    # Outcome Date GA Lbr Len/2nd Weight Sex Delivery Anes PTL Lv  1 Term 06/08/12 [id number] 13:37 / 01:08 6 lb 0.5 oz (2.735 kg) M Vag-Spont None  LIV     Birth Comments: No anomalies, significant caput noted/ occiput posterior presentation, swelling noted around eyes      The following portions of the patient's history were reviewed and updated as appropriate: allergies, current medications, past family history, past medical history, past social history, past surgical history and problem list.  Review of Systems Pertinent items are noted in HPI.    Objective:  BP 131/71   Pulse 76   Wt 245 lb (111.1 kg)   BMI 38.37 kg/m   General Appearance:    Alert, cooperative, no distress, appears stated age  Head:    Normocephalic, without obvious abnormality, atraumatic  Eyes:    conjunctiva/corneas clear, EOM's intact, both eyes  Ears:    Normal external ear canals, both ears  Nose:   Nares normal, septum midline, mucosa normal, no drainage    or sinus tenderness  Throat:   Lips, mucosa, and tongue normal; teeth and gums normal  Neck:   Supple, symmetrical, trachea midline, no adenopathy;    thyroid:  no enlargement/tenderness/nodules  Back:     Symmetric, no curvature, ROM normal, no CVA tenderness  Lungs:     Clear to auscultation bilaterally, respirations unlabored  Chest Wall:    No tenderness or deformity   Heart:    Regular rate and rhythm, S1 and S2 normal, no  murmur, rub   or gallop  Breast Exam:    No tenderness, masses, or nipple abnormality  Abdomen:     Soft, non-tender, bowel sounds active all four quadrants,    no masses, no organomegaly; well healed infraumbilical incision  Genitalia:    Normal female without lesion, discharge or tenderness     Extremities:   Extremities normal, atraumatic, no cyanosis or edema  Pulses:   2+ and symmetric all extremities  Skin:   Skin color, texture, turgor normal, no rashes or lesions       Assessment:    Healthy female exam.   Sterilization consult    Plan:    Follow up in: 1 year.    Mammogram in 07/2017 Info reviewed on female and female sterilization F/u PAP and hrHPV  Shelby Duran, M.D., Evern CoreFACOG

## 2017-02-04 NOTE — Patient Instructions (Addendum)
Levonorgestrel intrauterine device (IUD) What is this medicine? LEVONORGESTREL IUD (LEE voe nor jes trel) is a contraceptive (birth control) device. The device is placed inside the uterus by a healthcare professional. It is used to prevent pregnancy. This device can also be used to treat heavy bleeding that occurs during your period. This medicine may be used for other purposes; ask your health care provider or pharmacist if you have questions. COMMON BRAND NAME(S): Kyleena, LILETTA, Mirena, Skyla What should I tell my health care provider before I take this medicine? They need to know if you have any of these conditions: -abnormal Pap smear -cancer of the breast, uterus, or cervix -diabetes -endometritis -genital or pelvic infection now or in the past -have more than one sexual partner or your partner has more than one partner -heart disease -history of an ectopic or tubal pregnancy -immune system problems -IUD in place -liver disease or tumor -problems with blood clots or take blood-thinners -seizures -use intravenous drugs -uterus of unusual shape -vaginal bleeding that has not been explained -an unusual or allergic reaction to levonorgestrel, other hormones, silicone, or polyethylene, medicines, foods, dyes, or preservatives -pregnant or trying to get pregnant -breast-feeding How should I use this medicine? This device is placed inside the uterus by a health care professional. Talk to your pediatrician regarding the use of this medicine in children. Special care may be needed. Overdosage: If you think you have taken too much of this medicine contact a poison control center or emergency room at once. NOTE: This medicine is only for you. Do not share this medicine with others. What if I miss a dose? This does not apply. Depending on the brand of device you have inserted, the device will need to be replaced every 3 to 5 years if you wish to continue using this type of birth  control. What may interact with this medicine? Do not take this medicine with any of the following medications: -amprenavir -bosentan -fosamprenavir This medicine may also interact with the following medications: -aprepitant -armodafinil -barbiturate medicines for inducing sleep or treating seizures -bexarotene -boceprevir -griseofulvin -medicines to treat seizures like carbamazepine, ethotoin, felbamate, oxcarbazepine, phenytoin, topiramate -modafinil -pioglitazone -rifabutin -rifampin -rifapentine -some medicines to treat HIV infection like atazanavir, efavirenz, indinavir, lopinavir, nelfinavir, tipranavir, ritonavir -St. John's wort -warfarin This list may not describe all possible interactions. Give your health care provider a list of all the medicines, herbs, non-prescription drugs, or dietary supplements you use. Also tell them if you smoke, drink alcohol, or use illegal drugs. Some items may interact with your medicine. What should I watch for while using this medicine? Visit your doctor or health care professional for regular check ups. See your doctor if you or your partner has sexual contact with others, becomes HIV positive, or gets a sexual transmitted disease. This product does not protect you against HIV infection (AIDS) or other sexually transmitted diseases. You can check the placement of the IUD yourself by reaching up to the top of your vagina with clean fingers to feel the threads. Do not pull on the threads. It is a good habit to check placement after each menstrual period. Call your doctor right away if you feel more of the IUD than just the threads or if you cannot feel the threads at all. The IUD may come out by itself. You may become pregnant if the device comes out. If you notice that the IUD has come out use a backup birth control method like condoms and call your   health care provider. Using tampons will not change the position of the IUD and are okay to use  during your period. This IUD can be safely scanned with magnetic resonance imaging (MRI) only under specific conditions. Before you have an MRI, tell your healthcare provider that you have an IUD in place, and which type of IUD you have in place. What side effects may I notice from receiving this medicine? Side effects that you should report to your doctor or health care professional as soon as possible: -allergic reactions like skin rash, itching or hives, swelling of the face, lips, or tongue -fever, flu-like symptoms -genital sores -high blood pressure -no menstrual period for 6 weeks during use -pain, swelling, warmth in the leg -pelvic pain or tenderness -severe or sudden headache -signs of pregnancy -stomach cramping -sudden shortness of breath -trouble with balance, talking, or walking -unusual vaginal bleeding, discharge -yellowing of the eyes or skin Side effects that usually do not require medical attention (report to your doctor or health care professional if they continue or are bothersome): -acne -breast pain -change in sex drive or performance -changes in weight -cramping, dizziness, or faintness while the device is being inserted -headache -irregular menstrual bleeding within first 3 to 6 months of use -nausea This list may not describe all possible side effects. Call your doctor for medical advice about side effects. You may report side effects to FDA at 1-800-FDA-1088. Where should I keep my medicine? This does not apply. NOTE: This sheet is a summary. It may not cover all possible information. If you have questions about this medicine, talk to your doctor, pharmacist, or health care provider.  2018 Elsevier/Gold Standard (2016-05-01 14:14:56)  What You Need to Know About Female Sterilization Female sterilization is surgery to prevent pregnancy. In this surgery, the fallopian tubes are either blocked or closed off. This prevents eggs from reaching the uterus so  that the eggs cannot be fertilized by sperm and you cannot get pregnant. Sterilization is permanent. It should only be done if you are sure that you do not want to be able to have children. What are the sterilization surgery options? There are several kinds of female sterilization surgeries. They include:  Laparoscopic tubal ligation. In this surgery, the fallopian tubes are tied off, sealed with heat, or blocked with a clip, ring, or clamp. A small portion of each fallopian tube may also be removed. This surgery is done through several small cuts (incisions).  Postpartum tubal ligation. This is also called a mini-laparotomy. This surgery is done right after childbirth or 1 or 2 days after childbirth. In this surgery, the fallopian tubes are tied off, sealed with heat, or blocked with a clip, ring, or clamp. A small portion of each fallopian tube may also be removed. The surgery is done through a single incision.  Hysteroscopic sterilization. In this surgery, a tiny, spring-like coil is inserted through the cervix and uterus into the fallopian tubes. The coil causes scarring, which blocks the tubes. After the surgery, contraception should be used for 3 months to allow the scar tissue to form completely.  Is sterilization safe? Generally, sterilization is safe. Complications are rare. However, there are risks. They include:  Bleeding.  Infection.  Reaction to medicine used during the procedure.  Injury to surrounding organs.  Failure of the procedure.  How effective is sterilization? Sterilization is nearly 100% effective, but it can fail. Also, the fallopian tubes can grow back together over time. If this happens, you will  be able to get pregnant again. Women who have had this procedure have a higher chance of having an ectopic pregnancy. An ectopic pregnancy is a pregnancy that happens outside of the uterus. This kind of pregnancy is unsuccessful and can lead to serious bleeding if it is  not treated. What are the benefits?  It is usually effective for a lifetime.  It is usually safe.  It does not have the drawbacks of other types of birth control: That means: ? Your hormones are not affected. Because of this, your menstrual periods, sexual desire, and sexual performance will not be affected. ? There are no side effects. What are the drawbacks?  If you change your mind and decide that you want to have children, you may not be able to. Sterilization may be reversed, but a reversal is not always successful.  It does not provide protection against STDs (sexually transmitted diseases).  It increases the chance of having an ectopic pregnancy. This information is not intended to replace advice given to you by your health care provider. Make sure you discuss any questions you have with your health care provider. Document Released: 01/06/2008 Document Revised: 03/12/2016 Document Reviewed: 04/16/2015 Elsevier Interactive Patient Education  [employer]2018 Elsevier Inc.

## 2017-02-05 ENCOUNTER — Encounter: Payer: Self-pay | Admitting: Obstetrics & Gynecology

## 2017-02-08 LAB — CYTOLOGY - PAP
Diagnosis: NEGATIVE
HPV: NOT DETECTED

## 2017-02-09 ENCOUNTER — Ambulatory Visit: Payer: BC Managed Care – PPO | Admitting: Obstetrics & Gynecology

## 2017-04-21 ENCOUNTER — Encounter: Payer: Self-pay | Admitting: Emergency Medicine

## 2017-04-21 ENCOUNTER — Encounter: Payer: Self-pay | Admitting: Family Medicine

## 2017-04-21 ENCOUNTER — Ambulatory Visit (INDEPENDENT_AMBULATORY_CARE_PROVIDER_SITE_OTHER): Payer: BC Managed Care – PPO | Admitting: Family Medicine

## 2017-04-21 VITALS — BP 122/80 | HR 70 | Temp 99.3°F | Wt 249.8 lb

## 2017-04-21 DIAGNOSIS — Z7689 Persons encountering health services in other specified circumstances: Secondary | ICD-10-CM | POA: Diagnosis not present

## 2017-04-21 DIAGNOSIS — M79662 Pain in left lower leg: Secondary | ICD-10-CM | POA: Diagnosis not present

## 2017-04-21 MED ORDER — TRAMADOL HCL 50 MG PO TABS: 50.0000 mg | ORAL_TABLET | Freq: Three times a day (TID) | ORAL | 0 refills | Status: DC | PRN

## 2017-04-21 MED ORDER — KETOROLAC TROMETHAMINE 60 MG/2ML IM SOLN: 60.0000 mg | Freq: Once | INTRAMUSCULAR | Status: AC

## 2017-04-21 NOTE — Progress Notes (Signed)
Patient presents to clinic today to establish care and for acute concern.  SUBJECTIVE: PMH: Pt is a 46yo with no sig pmg. Patient seen at [facility] clinic for annual exam. As he is  Acute issues-patient states Monday woke up with left calf pain/inability to move leg. Pain noted with 11 out of 10. Patient endorses edema of left calf. Current pain 7-8 out of 10.  Patient has tried Motrin and Tylenol. Patient does not recall injury, denies hearing cracks or pops. Patient having significant difficulty ambulating.   Allergies: NKDA  Surgical history: Breast reduction  Social: Patient is married. Patient works at the clinic clerk of courts. Patient denies tobacco use, social ethanol use, no drug use.  Family medical history: Mom-HTN, asthma, polycystic kidney disease Dad-emphysema, tobacco use MGM-polycystic kidney disease Patient has 2 brothers one sister all with tobacco use  Health Maintenance: Mammogram -- this yr Pap- this yr   Past Medical History:  Diagnosis Date  . Allergy   . Unspecified vitamin D deficiency     Past Surgical History:  Procedure Laterality Date  . BREAST REDUCTION SURGERY    . WISDOM TOOTH EXTRACTION      Current Outpatient Prescriptions on File Prior to Visit  Medication Sig Dispense Refill  . ibuprofen (ADVIL,MOTRIN) 600 MG tablet Take 1 tablet (600 mg total) by mouth every 6 (six) hours as needed. 30 tablet 0  . levonorgestrel (MIRENA) 20 MCG/24HR IUD 1 each by Intrauterine route once.     No current facility-administered medications on file prior to visit.     No Known Allergies  Family History  Problem Relation Age of Onset  . Hypertension Mother   . Kidney disease Mother   . Asthma Father   . COPD Father   . Kidney disease Sister   . Multiple sclerosis Sister   . Asthma Brother     Social History   Social History  . Marital status: Married    Spouse name: Rharshi  . Number of children: 0  . Years of education: 12    Occupational History  . CLERICAL COURT  [employer]   Social History Main Topics  . Smoking status: Never Smoker  . Smokeless tobacco: Never Used  . Alcohol use Yes     Comment: occasional   . Drug use: No  . Sexual activity: Yes    Partners: Male    Birth control/ protection: IUD   Other Topics Concern  . 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[place])      Education: Engineer, agricultural    Tobacco Use/Exposure:  None    Alcohol Use:  Occasional   Drug Use:  None   Diet:  Regular   Exercise:  3 X week    Hobbies: Shopping                 ROS General: Denies fever, chills, night sweats, changes in weight, changes in appetite HEENT: Denies headaches, ear pain, changes in vision, rhinorrhea, sore throat CV: Denies CP, palpitations, SOB, orthopnea Pulm: Denies SOB, cough, wheezing GI: Denies abdominal pain, nausea, vomiting, diarrhea, constipation GU: Denies dysuria, hematuria, frequency, vaginal discharge Msk: Denies muscle cramps, joint pains  + left calf pain and edema, difficulty with ambulation Neuro: Denies weakness, numbness, tingling Skin:  Denies rashes, bruising Psych: Denies depression, anxiety, hallucinations  BP 122/80 (BP Location: Left Arm, Patient Position: Sitting, Cuff Size: Large)   Pulse 70   Temp 99.3 F (37.4 C) (Oral)   Wt 249 lb 12.8 oz (113.3 kg)   BMI 39.12 kg/m   Physical Exam  Gen. Pleasant, well developed, well-nourished, in NAD HEENT - [place]/AT, PERRL, EOMI, conjunctive clear, no scleral icterus, no nasal drainage, pharynx without erythema or exudate. Lungs: no use of accessory muscles, no dullness to percussion, CTAB, no wheezes, rales or rhonchi Cardiovascular: RRR, No r/g/m, 1+ pitting edema bilateral lower extremities. Abdomen: BS present, soft,  nontender,nondistended, no hepatosplenomegaly Musculoskeletal: No deformities, moves all four extremities, no cyanosis or clubbing, normal tone. Popliteal pulse 2+ b/l.  No TTP of spine, right LLE. TTP of left calf.  Bilateral calf tightness. Negative Homans sign.  Negative logroll, negative straight leg raise, negative FADIR, negative FABER.  Right calf 45.8 cm, left calf 47.5 cm Neuro:  A&Ox3, CN II-XII intact, slow gait favoring left leg, holding onto items to get around. Skin:  Warm, dry, intact, no lesions Psych: normal affect, mood appropriate   Recent Results (from the past 2160 hour(s))  Cytology - PAP     Status: None   Collection Time: 02/04/17 12:00 AM  Result Value Ref Range   Adequacy      Satisfactory for evaluation  endocervical/transformation zone component PRESENT.   Diagnosis      NEGATIVE FOR INTRAEPITHELIAL LESIONS OR MALIGNANCY.   Diagnosis      FUNGAL ORGANISMS PRESENT CONSISTENT WITH CANDIDA SPP.   HPV NOT DETECTED     Comment: Normal Reference Range - NOT Detected   Material Submitted CervicoVaginal Pap [ThinPrep Imaged]     Assessment/Plan: Pain of left calf  -Possible DVT, muscle strain, neuropathic pain. -R calf 45.8cm  L calf 47.5cm -Stat Doppler ultrasound ordered. Radiology called however, unable to complete study today 2/2 to staff leaving at 4 PM. Stated patient can come by in the morning. Patient advised to proceed to emergency department.   - Plan: ketorolac (TORADOL) injection 60 mg, traMADol (ULTRAM) 50 MG tablet, D-dimer, Quantitative, CBC with Differential/Platelet, Vitamin B12, Vitamin B12, D-dimer, Quantitative, CBC with Differential/Platelet - Plan: VAS [place] LOWER EXTREMITY VENOUS (DVT)   Encounter to establish care -Records release   F/u prn. May need sooner follow-up depending on results of Doppler.

## 2017-04-21 NOTE — Patient Instructions (Signed)
We have ordered labs at this visit. It can take up to 1-2 weeks for results and processing. If results require follow up or explanation, we will call you with instructions. Clinically stable results will be released to your MYCHART or sent to you via mail. If you have not heard from us or cannot find your results in MYCHART in 2 weeks please contact our office at [phone].    

## 2017-04-22 ENCOUNTER — Ambulatory Visit (HOSPITAL_COMMUNITY)
Admission: RE | Admit: 2017-04-22 | Discharge: 2017-04-22 | Disposition: A | Discharge status: Home / Self Care | Payer: BC Managed Care – PPO | Source: Ambulatory Visit | Attending: Family Medicine | Admitting: Family Medicine

## 2017-04-22 ENCOUNTER — Other Ambulatory Visit: Payer: Self-pay | Admitting: Emergency Medicine

## 2017-04-22 ENCOUNTER — Telehealth: Payer: Self-pay | Admitting: Family Medicine

## 2017-04-22 ENCOUNTER — Encounter: Payer: Self-pay | Admitting: Family Medicine

## 2017-04-22 DIAGNOSIS — M79662 Pain in left lower leg: Secondary | ICD-10-CM

## 2017-04-22 DIAGNOSIS — M7989 Other specified soft tissue disorders: Secondary | ICD-10-CM | POA: Diagnosis present

## 2017-04-22 LAB — CBC WITH DIFFERENTIAL/PLATELET
BASOS ABS: 0 10*3/uL (ref 0.0–0.1)
BASOS PCT: 1 % (ref 0.0–3.0)
EOS ABS: 0 10*3/uL (ref 0.0–0.7)
Eosinophils Relative: 0.1 % (ref 0.0–5.0)
HEMATOCRIT: 35.9 % — AB (ref 36.0–46.0)
Hemoglobin: 11.6 g/dL — ABNORMAL LOW (ref 12.0–15.0)
LYMPHS ABS: 1.4 10*3/uL (ref 0.7–4.0)
Lymphocytes Relative: 29 % (ref 12.0–46.0)
MCHC: 32.4 g/dL (ref 30.0–36.0)
MCV: 90.5 fl (ref 78.0–100.0)
Monocytes Absolute: 0.2 10*3/uL (ref 0.1–1.0)
Monocytes Relative: 4 % (ref 3.0–12.0)
NEUTROS PCT: 65.9 % (ref 43.0–77.0)
Neutro Abs: 3.1 10*3/uL (ref 1.4–7.7)
PLATELETS: 374 10*3/uL (ref 150.0–400.0)
RBC: 3.96 Mil/uL (ref 3.87–5.11)
RDW: 15.1 % (ref 11.5–15.5)
WBC: 4.7 10*3/uL (ref 4.0–10.5)

## 2017-04-22 LAB — D-DIMER, QUANTITATIVE: D-Dimer, Quant: 0.91 mcg/mL FEU — ABNORMAL HIGH (ref <0.50)

## 2017-04-22 LAB — VITAMIN B12: VITAMIN B 12: 412 pg/mL (ref 211–911)

## 2017-04-22 MED ORDER — TRAMADOL HCL 50 MG PO TABS: 50.0000 mg | ORAL_TABLET | Freq: Three times a day (TID) | ORAL | 0 refills | Status: AC | PRN

## 2017-04-22 NOTE — Telephone Encounter (Signed)
[facility] not [facility]  traMADol (ULTRAM) 50 MG tablet  Would like to know if you can resend? Thanks   [id number] [place], [place] - [address] [phone] (Phone) [phone] (Fax

## 2017-04-22 NOTE — Progress Notes (Signed)
VASCULAR LAB PRELIMINARY  PRELIMINARY  PRELIMINARY  PRELIMINARY  Left lower extremity venous duplex completed.    Preliminary report:  Left:  No evidence of DVT, superficial thrombosis, or Baker's cyst.  Jah Alarid, RVS 04/22/2017, 9:54 AM

## 2017-04-22 NOTE — Telephone Encounter (Signed)
Medication has been called into the pharmacy 

## 2017-04-22 NOTE — Addendum Note (Signed)
Addended by: Vito Backers on: 04/22/2017 10:41 AM   Modules accepted: Orders

## 2017-04-23 ENCOUNTER — Telehealth: Payer: Self-pay | Admitting: Family Medicine

## 2017-04-23 NOTE — Telephone Encounter (Signed)
Received PA request for Tramadol. PA submitted & is pending. Key: Sydnee Levans

## 2017-04-26 NOTE — Telephone Encounter (Signed)
PA approved, form faxed back to pharmacy. 

## 2017-08-05 ENCOUNTER — Other Ambulatory Visit: Payer: Self-pay | Admitting: Cardiology

## 2017-08-05 ENCOUNTER — Other Ambulatory Visit: Payer: Self-pay | Admitting: Family Medicine

## 2017-08-05 DIAGNOSIS — Z1231 Encounter for screening mammogram for malignant neoplasm of breast: Secondary | ICD-10-CM

## 2017-08-24 ENCOUNTER — Ambulatory Visit
Admission: RE | Admit: 2017-08-24 | Discharge: 2017-08-24 | Disposition: A | Discharge status: Home / Self Care | Payer: BC Managed Care – PPO | Source: Ambulatory Visit | Attending: Family Medicine | Admitting: Family Medicine

## 2017-08-24 DIAGNOSIS — Z1231 Encounter for screening mammogram for malignant neoplasm of breast: Secondary | ICD-10-CM

## 2017-11-23 ENCOUNTER — Telehealth: Payer: Self-pay | Admitting: Family Medicine

## 2017-11-23 NOTE — Telephone Encounter (Signed)
Copied from CRM [phone]. Topic: Quick Communication - Rx Refill/Question >> Nov 23, 2017 11:01 AM Louie BunPalacios Medina, Rosey Batheresa D wrote: Medication: Zyrtec Has the patient contacted their pharmacy? No, new rx (Agent: If no, request that the patient contact the pharmacy for the refill.) Preferred Pharmacy (with phone number or street name): [id number] [place], [place] - [address] Agent: Please be advised that RX refills may take up to 3 business days. We ask that you follow-up with your pharmacy.

## 2017-11-24 NOTE — Telephone Encounter (Signed)
Ok to send Rx for Zyrtec to the pharmacy? Please advise.

## 2017-11-24 NOTE — Telephone Encounter (Signed)
Patient has not been prescribed this medication.  PCP Dr. Salomon FickBanks

## 2017-11-24 NOTE — Telephone Encounter (Signed)
Ok to send in Zyrtec.

## 2017-11-25 ENCOUNTER — Other Ambulatory Visit: Payer: Self-pay | Admitting: Family Medicine

## 2017-11-25 MED ORDER — CETIRIZINE HCL 10 MG PO TABS: 10.0000 mg | ORAL_TABLET | Freq: Every day | ORAL | 2 refills | Status: AC

## 2017-11-25 NOTE — Telephone Encounter (Signed)
Medication was sent to the pharmacy  °

## 2017-11-26 NOTE — Telephone Encounter (Signed)
Pt aware Rx sent in, and thank you.

## 2018-07-04 ENCOUNTER — Ambulatory Visit: Payer: BC Managed Care – PPO | Admitting: Obstetrics & Gynecology

## 2018-07-29 ENCOUNTER — Other Ambulatory Visit: Payer: Self-pay | Admitting: Family Medicine

## 2018-07-29 DIAGNOSIS — Z1231 Encounter for screening mammogram for malignant neoplasm of breast: Secondary | ICD-10-CM

## 2018-09-05 ENCOUNTER — Ambulatory Visit: Payer: BC Managed Care – PPO

## 2019-05-25 ENCOUNTER — Other Ambulatory Visit: Payer: Self-pay | Admitting: Family Medicine

## 2019-05-25 ENCOUNTER — Other Ambulatory Visit: Payer: Self-pay | Admitting: Physician Assistant

## 2019-05-25 DIAGNOSIS — Z1231 Encounter for screening mammogram for malignant neoplasm of breast: Secondary | ICD-10-CM

## 2019-07-13 ENCOUNTER — Ambulatory Visit
Admission: RE | Admit: 2019-07-13 | Discharge: 2019-07-13 | Disposition: A | Discharge status: Home / Self Care | Payer: BC Managed Care – PPO | Source: Ambulatory Visit | Attending: Physician Assistant | Admitting: Physician Assistant

## 2019-07-13 ENCOUNTER — Other Ambulatory Visit: Payer: Self-pay

## 2019-07-13 DIAGNOSIS — Z1231 Encounter for screening mammogram for malignant neoplasm of breast: Secondary | ICD-10-CM

## 2019-07-17 ENCOUNTER — Other Ambulatory Visit: Payer: Self-pay | Admitting: Physician Assistant

## 2019-07-17 DIAGNOSIS — R928 Other abnormal and inconclusive findings on diagnostic imaging of breast: Secondary | ICD-10-CM

## 2019-07-18 ENCOUNTER — Other Ambulatory Visit: Payer: Self-pay | Admitting: Physician Assistant

## 2019-07-18 ENCOUNTER — Other Ambulatory Visit: Payer: Self-pay | Admitting: Family Medicine

## 2019-07-18 DIAGNOSIS — R928 Other abnormal and inconclusive findings on diagnostic imaging of breast: Secondary | ICD-10-CM

## 2019-07-19 ENCOUNTER — Ambulatory Visit
Admission: RE | Admit: 2019-07-19 | Discharge: 2019-07-19 | Disposition: A | Discharge status: Home / Self Care | Payer: BC Managed Care – PPO | Source: Ambulatory Visit | Attending: Physician Assistant | Admitting: Physician Assistant

## 2019-07-19 ENCOUNTER — Ambulatory Visit: Payer: BC Managed Care – PPO

## 2019-07-19 ENCOUNTER — Other Ambulatory Visit: Payer: Self-pay

## 2019-07-19 DIAGNOSIS — R928 Other abnormal and inconclusive findings on diagnostic imaging of breast: Secondary | ICD-10-CM

## 2020-02-16 ENCOUNTER — Ambulatory Visit: Payer: BC Managed Care – PPO | Admitting: Obstetrics and Gynecology

## 2020-03-21 ENCOUNTER — Ambulatory Visit: Payer: BC Managed Care – PPO | Admitting: Obstetrics and Gynecology

## 2020-05-02 ENCOUNTER — Ambulatory Visit (INDEPENDENT_AMBULATORY_CARE_PROVIDER_SITE_OTHER): Payer: BC Managed Care – PPO | Admitting: Obstetrics and Gynecology

## 2020-05-02 ENCOUNTER — Encounter: Payer: Self-pay | Admitting: Obstetrics and Gynecology

## 2020-05-02 ENCOUNTER — Other Ambulatory Visit: Payer: Self-pay

## 2020-05-02 DIAGNOSIS — Z3009 Encounter for other general counseling and advice on contraception: Secondary | ICD-10-CM

## 2020-05-02 NOTE — Progress Notes (Signed)
   Subjective:    Patient ID: Shelby Duran, female    DOB: 1970/12/23, 49 y.o.   MRN: [id number]  HPI  49 yo G1P1 seen for consultation for tubal ligation.  Pt currently has an IUD placed 2018-2019 by a private physician.  Dr. Cecille Po? She has irregular mild spotting and no cramping.  Pt wants tubal ligation, but discussed she has just as an effective method currently that will last into her 54s and can be replaced.  Discussed likelihood of spontaneous pregnancy in the 32s is very low and rate of miscarriage will increase.  Information given on IUD use.       Objective:         Assessment & Plan:   1. Encounter for sterilization education Pt will check with private gyn to see exactly when her current IUD was placed since it is not in our records. She will continue with current IUD at this time.  Total face-to-face time with patient: 20 minutes. Over 50% of encounter was spent on counseling and coordination of care.

## 2020-05-02 NOTE — Patient Instructions (Signed)

## 2020-07-22 ENCOUNTER — Other Ambulatory Visit: Payer: Self-pay | Admitting: Family Medicine

## 2020-07-22 DIAGNOSIS — Z1231 Encounter for screening mammogram for malignant neoplasm of breast: Secondary | ICD-10-CM

## 2020-07-25 ENCOUNTER — Ambulatory Visit
Admission: RE | Admit: 2020-07-25 | Discharge: 2020-07-25 | Disposition: A | Discharge status: Home / Self Care | Payer: BC Managed Care – PPO | Source: Ambulatory Visit | Attending: Family Medicine | Admitting: Family Medicine

## 2020-07-25 ENCOUNTER — Other Ambulatory Visit: Payer: Self-pay

## 2020-07-25 DIAGNOSIS — Z1231 Encounter for screening mammogram for malignant neoplasm of breast: Secondary | ICD-10-CM

## 2021-03-24 ENCOUNTER — Other Ambulatory Visit (HOSPITAL_COMMUNITY)
Admission: RE | Admit: 2021-03-24 | Discharge: 2021-03-24 | Disposition: A | Discharge status: Home / Self Care | Payer: Managed Care, Other (non HMO) | Source: Ambulatory Visit | Attending: Nurse Practitioner | Admitting: Nurse Practitioner

## 2021-03-24 ENCOUNTER — Encounter: Payer: Self-pay | Admitting: Nurse Practitioner

## 2021-03-24 ENCOUNTER — Other Ambulatory Visit: Payer: Self-pay

## 2021-03-24 ENCOUNTER — Ambulatory Visit (INDEPENDENT_AMBULATORY_CARE_PROVIDER_SITE_OTHER): Payer: Managed Care, Other (non HMO) | Admitting: Nurse Practitioner

## 2021-03-24 VITALS — BP 141/88 | Ht 67.0 in | Wt 240.4 lb

## 2021-03-24 DIAGNOSIS — Z3202 Encounter for pregnancy test, result negative: Secondary | ICD-10-CM | POA: Diagnosis not present

## 2021-03-24 DIAGNOSIS — Z0000 Encounter for general adult medical examination without abnormal findings: Secondary | ICD-10-CM | POA: Diagnosis not present

## 2021-03-24 DIAGNOSIS — Z118 Encounter for screening for other infectious and parasitic diseases: Secondary | ICD-10-CM | POA: Insufficient documentation

## 2021-03-24 DIAGNOSIS — Z975 Presence of (intrauterine) contraceptive device: Secondary | ICD-10-CM

## 2021-03-24 DIAGNOSIS — Z30433 Encounter for removal and reinsertion of intrauterine contraceptive device: Secondary | ICD-10-CM

## 2021-03-24 LAB — POCT PREGNANCY, URINE: Preg Test, Ur: NEGATIVE

## 2021-03-24 MED ORDER — LEVONORGESTREL 20.1 MCG/DAY IU IUD
1.0000 | INTRAUTERINE_SYSTEM | Freq: Once | INTRAUTERINE | Status: AC
  Administered 2021-03-24: 1 via INTRAUTERINE

## 2021-03-24 NOTE — Progress Notes (Signed)
      GYNECOLOGY OFFICE PROCEDURE NOTE  Shelby Duran is a 50 y.o. G1P1001 here for Mirena IUD removal and reinsertion - liletta IUD available and is the same active ingredients.  Discussed Mirena IUD is not available in this office and she can make another appointment at one of our other offices but after discussion, decided to have Liletta Inserted today as the active ingredient is the same and we use these 2 IUDs as the same.  Requesting STD testing but otherwise no GYN concerns.  Last pap smear was in 2018 and was normal.  Wants IUD as she has a friend who had an unexpected pregnancy at age 22.  IUD Removal and Reinsertion  Patient identified, informed consent performed, consent signed.   Discussed risks of irregular bleeding, cramping, infection, malpositioning or misplacement of the IUD outside the uterus which may require further procedures. Also discussed >99% contraception efficacy, increased risk of ectopic pregnancy with failure of method.   Emphasized that this did not protect against STIs, condoms recommended during all sexual encounters.Advised to use backup contraception for one week as the risk of pregnancy is higher during the transition period of removing an IUD and replacing it with another one. Time out was performed. Bimanual done.  Speculum placed in the vagina. Pap done and STD swab done.  The strings of the IUD were grasped and pulled using forceps. The IUD was easily and successfully removed in its entirety. The cervix was cleaned with Betadine x 2 and grasped posteriorly with a single tooth tenaculum.  Uterus sounded with endometrial biopsy pipette - 10 cm.  The new Liletta IUD was then placed per manufacturer's recommendations. Strings trimmed to 2 cm. Tenaculum was removed, good hemostasis noted. Patient tolerated procedure well.   Patient was given post-procedure instructions.  She was reminded to have backup contraception for one week during this transition period  between IUDs.  Patient was also asked to check IUD strings periodically and follow up in 4 weeks for IUD check.   Nolene Bernheim, RN, MSN, NP-BC Nurse Practitioner, [facility] for [employer], [facility] 03/24/2021 3:53 PM

## 2021-03-25 LAB — CERVICOVAGINAL ANCILLARY ONLY
Bacterial Vaginitis (gardnerella): NEGATIVE
Candida Glabrata: NEGATIVE
Candida Vaginitis: NEGATIVE
Comment: NEGATIVE
Comment: NEGATIVE
Comment: NEGATIVE

## 2021-03-27 LAB — CYTOLOGY - PAP
Chlamydia: NEGATIVE
Comment: NEGATIVE
Comment: NEGATIVE
Comment: NORMAL
Diagnosis: NEGATIVE
High risk HPV: NEGATIVE
Neisseria Gonorrhea: NEGATIVE

## 2021-04-23 ENCOUNTER — Other Ambulatory Visit: Payer: Self-pay

## 2021-04-23 ENCOUNTER — Encounter: Payer: Self-pay | Admitting: Family Medicine

## 2021-04-23 ENCOUNTER — Ambulatory Visit (INDEPENDENT_AMBULATORY_CARE_PROVIDER_SITE_OTHER): Payer: Managed Care, Other (non HMO) | Admitting: Family Medicine

## 2021-04-23 VITALS — BP 137/96 | HR 79 | Wt 241.3 lb

## 2021-04-23 DIAGNOSIS — Z30431 Encounter for routine checking of intrauterine contraceptive device: Secondary | ICD-10-CM

## 2021-04-23 MED ORDER — MEDROXYPROGESTERONE ACETATE 10 MG PO TABS: 10.0000 mg | ORAL_TABLET | Freq: Every day | ORAL | 0 refills | Status: DC

## 2021-04-23 NOTE — Progress Notes (Signed)
   Subjective:   Patient Name: Shelby Duran, female   DOB: [birth year], 50 y.o.  MRN: [id number]  HPI Patient here for an IUD check.  She had the Liletta IUD placed 1 month ago.  She reports bleeding daily.   Review of Systems  Constitutional: Negative for fever and chills.  Gastrointestinal: Negative for abdominal pain.  Genitourinary: Negative for vaginal discharge, vaginal pain, pelvic pain and dyspareunia.        Objective:   Physical Exam  Constitutional: She appears well-developed and well-nourished.  HENT:  Head: Normocephalic and atraumatic.  Abdominal: Soft. There is no tenderness. There is no guarding.  Genitourinary: There is no rash, tenderness or lesion on the right labia. There is no rash, tenderness or lesion on the left labia. No erythema or tenderness in the vagina. No foreign body around the vagina. No signs of injury around the vagina. No vaginal discharge found.    Skin: Skin is warm and dry.  Psychiatric: She has a normal mood and affect. Her behavior is normal. Judgment and thought content normal.       Assessment & Plan:  1. IUD check up IUD in place.  Start provera 10mg  daily. Pt to call with any other problems.  F/u in 2 months.

## 2021-05-03 ENCOUNTER — Encounter: Payer: Self-pay | Admitting: Radiology

## 2021-06-18 ENCOUNTER — Ambulatory Visit: Payer: Managed Care, Other (non HMO) | Admitting: Family Medicine

## 2021-06-25 ENCOUNTER — Other Ambulatory Visit: Payer: Self-pay | Admitting: Family Medicine

## 2021-08-05 ENCOUNTER — Other Ambulatory Visit: Payer: Self-pay | Admitting: Internal Medicine

## 2021-08-05 DIAGNOSIS — Z1231 Encounter for screening mammogram for malignant neoplasm of breast: Secondary | ICD-10-CM

## 2021-08-20 ENCOUNTER — Ambulatory Visit
Admission: RE | Admit: 2021-08-20 | Discharge: 2021-08-20 | Disposition: A | Discharge status: Home / Self Care | Payer: PRIVATE HEALTH INSURANCE | Source: Ambulatory Visit | Attending: Internal Medicine | Admitting: Internal Medicine

## 2021-08-20 DIAGNOSIS — Z1231 Encounter for screening mammogram for malignant neoplasm of breast: Secondary | ICD-10-CM

## 2021-08-22 ENCOUNTER — Ambulatory Visit: Payer: Managed Care, Other (non HMO)

## 2021-09-04 ENCOUNTER — Other Ambulatory Visit: Payer: Self-pay | Admitting: Family Medicine

## 2022-06-30 ENCOUNTER — Other Ambulatory Visit: Payer: Self-pay | Admitting: Internal Medicine

## 2022-06-30 DIAGNOSIS — Z1231 Encounter for screening mammogram for malignant neoplasm of breast: Secondary | ICD-10-CM

## 2022-08-17 IMAGING — MG DIGITAL SCREENING BILAT W/ TOMO W/ CAD
8 series · 8 of 24 positions shown · non-contrast
Comparison: Previous exam(s).

CLINICAL DATA: Screening.

EXAM:
DIGITAL SCREENING BILATERAL MAMMOGRAM WITH TOMO AND CAD

[L MLO synth-2D]
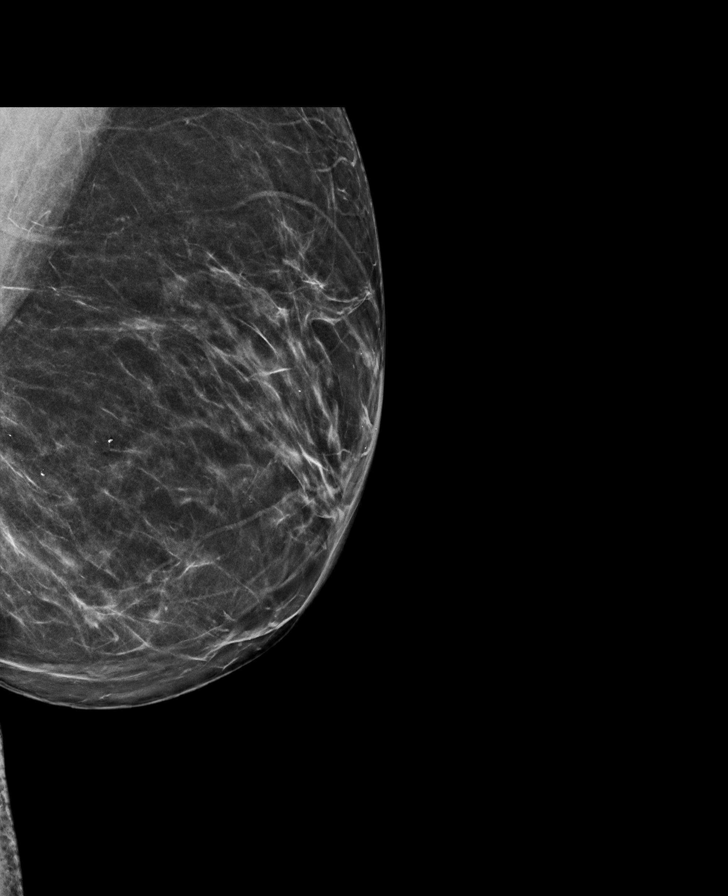

[R MLO synth-2D]
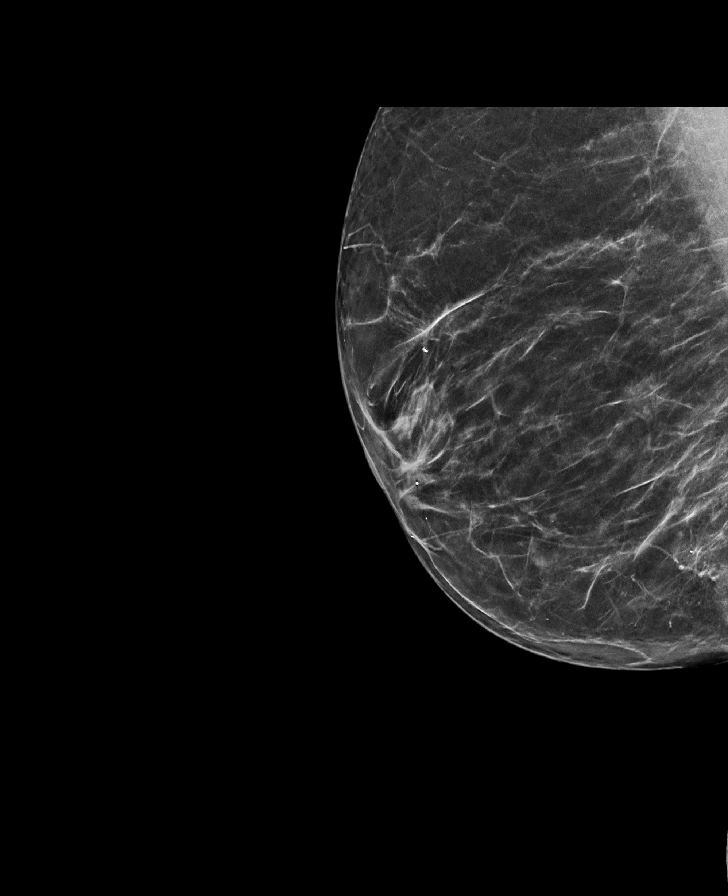

[L CC synth-2D]
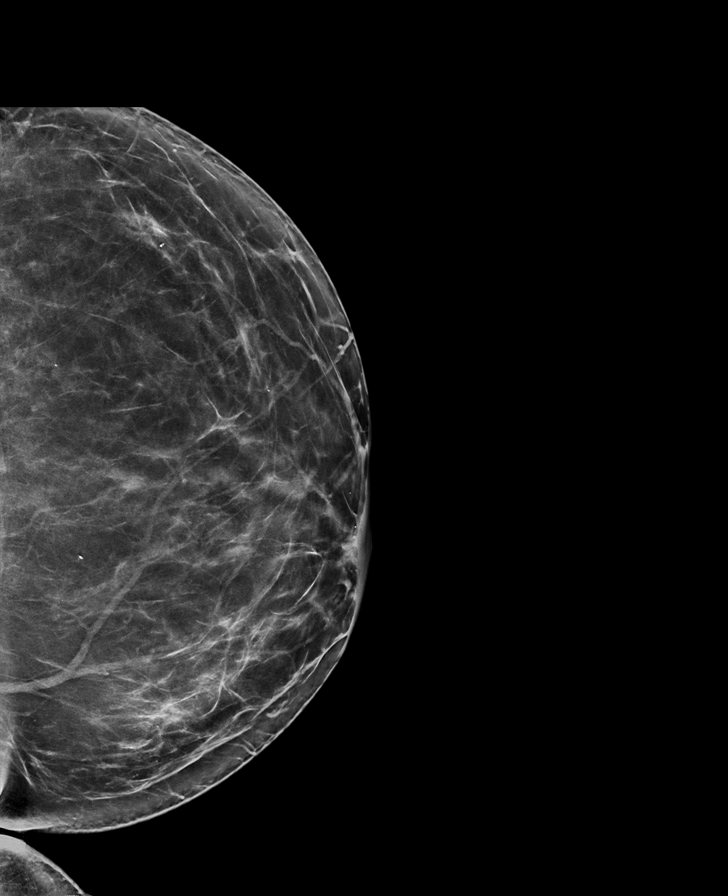

[R CC synth-2D]
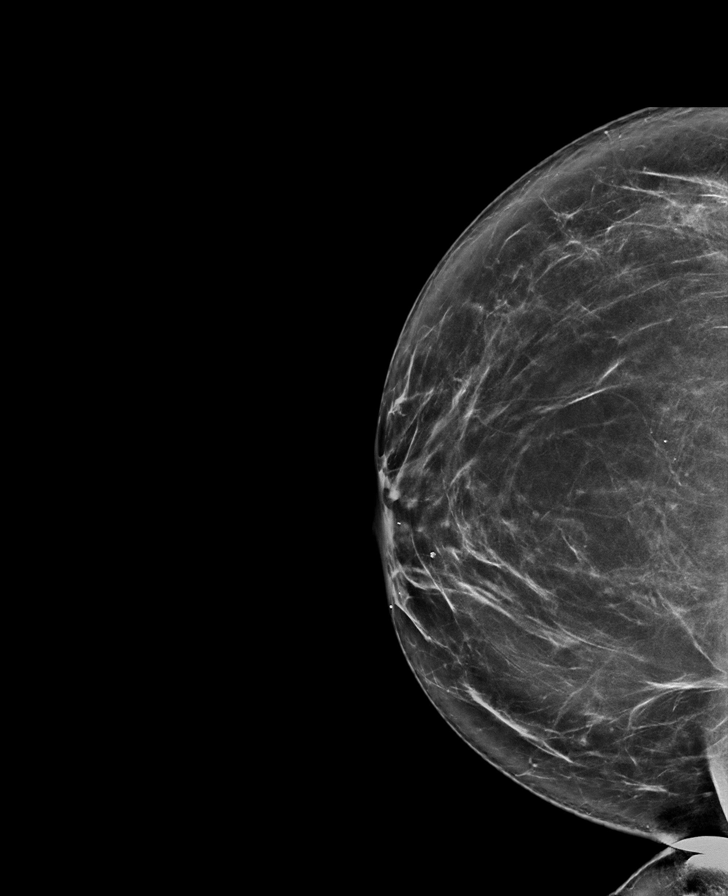

[L MLO tomo · tomo slice 41/81.0]
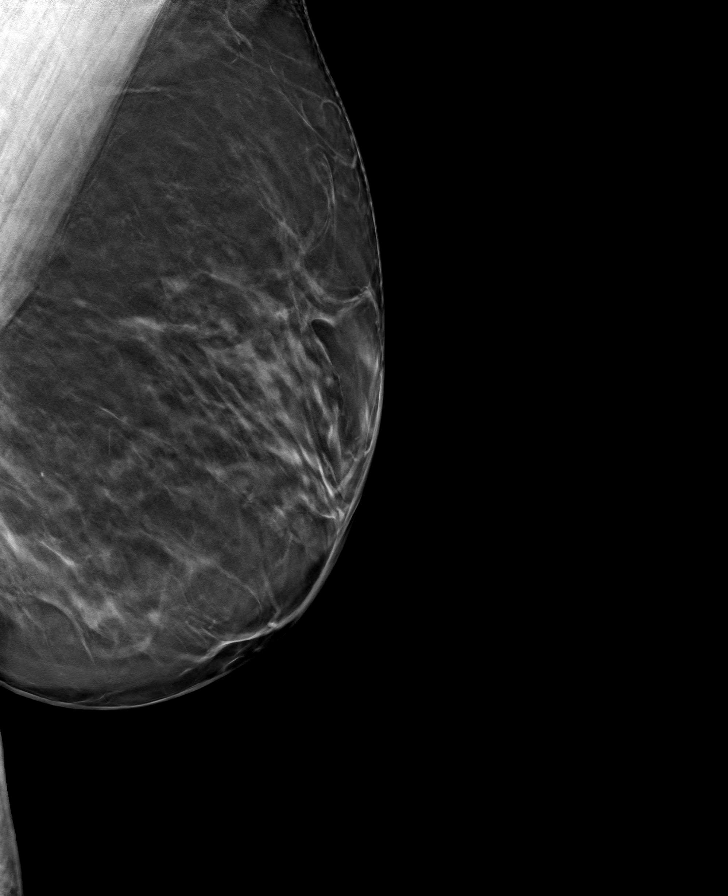

[R CC tomo · tomo slice 42/83.0]
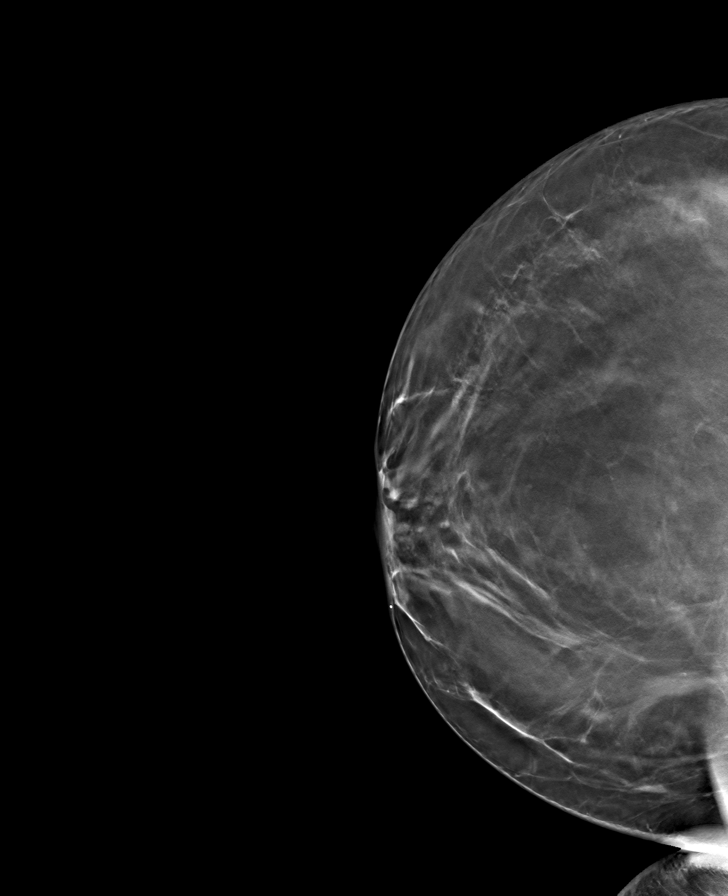

[R MLO tomo · tomo slice 39/78.0]
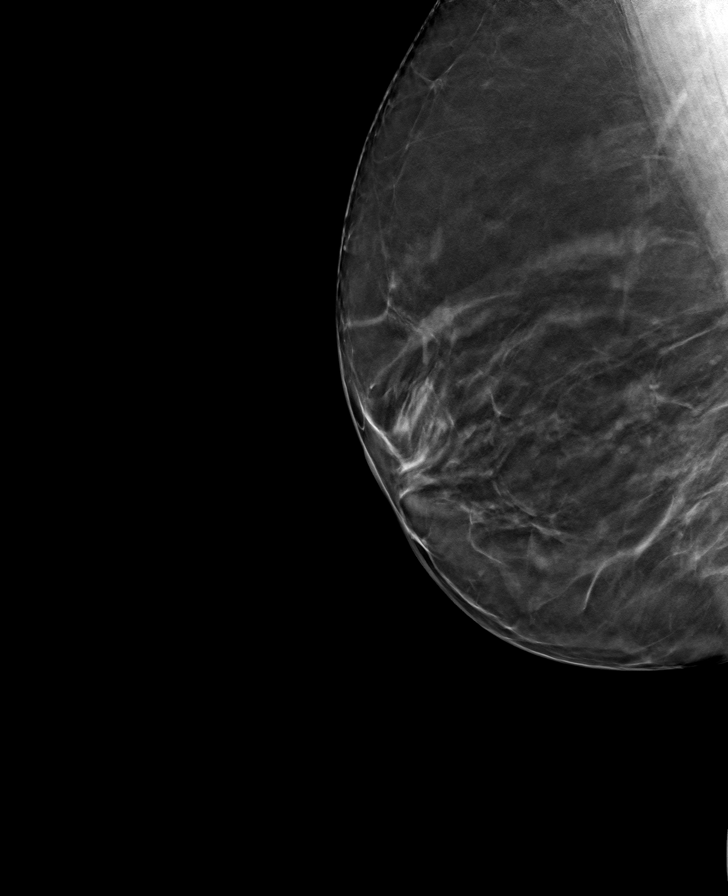

[L CC tomo · tomo slice 40/79.0]
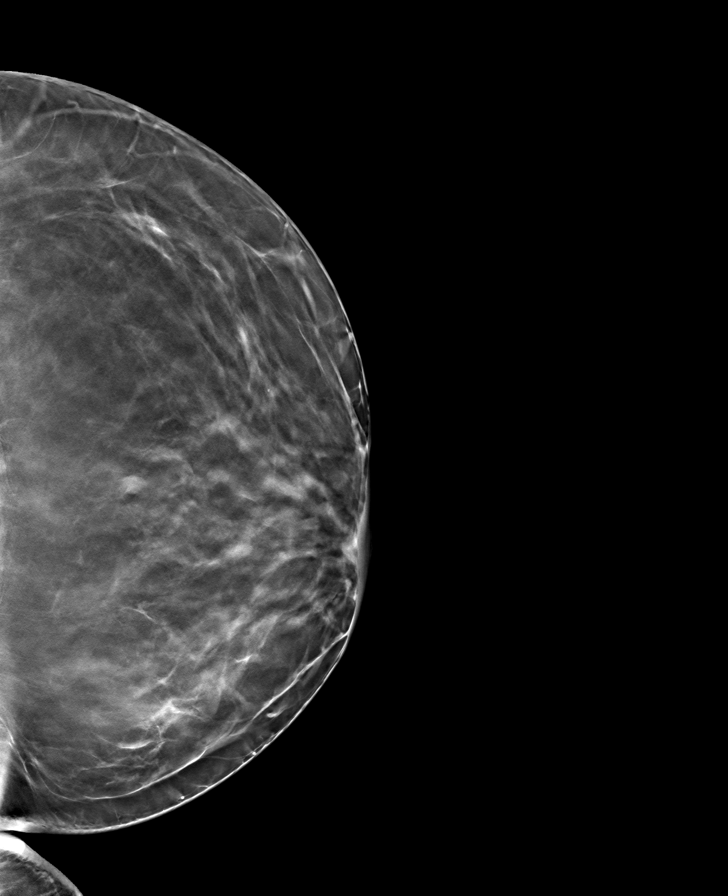

[8 of 24 positions shown; findings below may reference images not displayed]

ACR Breast Density Category b: There are scattered areas of
fibroglandular density.
FINDINGS: There are no findings suspicious for malignancy. Images were
processed with CAD.
IMPRESSION: No mammographic evidence of malignancy. A result letter of this
screening mammogram will be mailed directly to the patient.

RECOMMENDATION:
Screening mammogram in one year. (Code:[id number])

BI-RADS CATEGORY  1: Negative.

## 2022-08-24 ENCOUNTER — Ambulatory Visit: Payer: PRIVATE HEALTH INSURANCE

## 2022-08-26 ENCOUNTER — Ambulatory Visit
Admission: RE | Admit: 2022-08-26 | Discharge: 2022-08-26 | Disposition: A | Discharge status: Home / Self Care | Payer: BC Managed Care – PPO | Source: Ambulatory Visit | Attending: Internal Medicine | Admitting: Internal Medicine

## 2022-08-26 DIAGNOSIS — Z1231 Encounter for screening mammogram for malignant neoplasm of breast: Secondary | ICD-10-CM

## 2023-04-07 ENCOUNTER — Ambulatory Visit (INDEPENDENT_AMBULATORY_CARE_PROVIDER_SITE_OTHER): Payer: Medicaid Other | Admitting: Vascular Surgery

## 2023-04-07 ENCOUNTER — Telehealth: Payer: Self-pay

## 2023-04-07 ENCOUNTER — Encounter: Payer: Self-pay | Admitting: Vascular Surgery

## 2023-04-07 VITALS — BP 128/91 | HR 81 | Temp 97.9°F | Resp 18 | Ht 67.0 in | Wt 243.5 lb

## 2023-04-07 DIAGNOSIS — I83813 Varicose veins of bilateral lower extremities with pain: Secondary | ICD-10-CM

## 2023-04-07 NOTE — Progress Notes (Signed)
ASSESSMENT & PLAN   CHRONIC VENOUS DISEASE: This patient has CEAP C1 venous disease (telangiectasias).  She does not have evidence of advanced venous disease and I do not think her leg pain was related to venous disease.  She was having back issues at the time of her leg pain and this improved with muscle relaxants.  Thus I suspect her leg pain was related to her back.  She does not give any history of claudication, rest pain, or nonhealing ulcers.  She has palpable pedal pulses.  I do not think she has any evidence of arterial insufficiency.  She does have some telangiectasias and would like to consider sclerotherapy.  I have given her Cherene Julian, RN's card so she can discuss this with her.  We have discussed conservative measures.  I have encouraged her to avoid prolonged sitting and standing.  We have discussed the importance of exercise specifically walking and water aerobics.  We discussed the use of compression stockings.  I encouraged her to elevate her legs daily.  We also discussed the importance of maintaining a healthy weight.  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  The consult is requested by Saundra Shelling, NP.  HPI:   Shelby Duran is a 52 y.o. female who was referred with leg pain and also some telangiectasias bilaterally.  I have reviewed the records from the referring office.  The patient is been complaining of leg pain it was felt that her venous disease might be contributing.  She sent for vascular consultation.  On my history, the patient developed some leg pain about 2 to 3 months ago.  At the same time she was having some back pain.  She was placed on a muscle relaxant which helped her pain.  I do not get any history of claudication, rest pain, or nonhealing ulcers.  With respect to her venous disease she does not describe consistent problems with aching pain and heaviness in her legs associated with sitting and standing.  She  has not had any problems with leg swelling.  She has no previous history of DVT.  She has had no previous venous procedures.  She does not wear compression stockings.  She does not routinely elevate her legs.  Past Medical History:  Diagnosis Date   Allergy    Unspecified vitamin D deficiency     Family History  Problem Relation Age of Onset   Hypertension Mother    Kidney disease Mother    Asthma Father    COPD Father    Kidney disease Sister    Multiple sclerosis Sister    Asthma Brother     SOCIAL HISTORY: Social History   Tobacco Use   Smoking status: Never   Smokeless tobacco: Never  Substance Use Topics   Alcohol use: Yes    Comment: occasional     No Known Allergies  Current Outpatient Medications  Medication Sig Dispense Refill   cetirizine (ZYRTEC) 10 MG tablet Take by mouth.     ergocalciferol (VITAMIN D2) 1.25 MG (50000 UT) capsule TAKE 1 (ONE) CAPSULE BY MOUTH WEEKLY     levonorgestrel (LILETTA, 52 MG,) 20.1 MCG/DAY IUD 1 each by Intrauterine route once.     levonorgestrel (MIRENA) 20 MCG/24HR IUD 1 each by Intrauterine route once.     medroxyPROGESTERone (PROVERA) 10 MG tablet TAKE 1 TABLET BY MOUTH EVERY DAY 90 tablet 3   cetirizine (ZYRTEC) 10 MG tablet Take 1 tablet (10  mg total) by mouth daily. 90 tablet 2   diclofenac (VOLTAREN) 50 MG EC tablet Take by mouth.     ibuprofen (ADVIL,MOTRIN) 600 MG tablet Take 1 tablet (600 mg total) by mouth every 6 (six) hours as needed. (Patient not taking: Reported on 03/24/2021) 30 tablet 0   topiramate (TOPAMAX) 100 MG tablet Take 100 mg by mouth 2 (two) times daily as needed.     traMADol (ULTRAM) 50 MG tablet Take 1 tablet (50 mg total) by mouth every 8 (eight) hours as needed. (Patient not taking: Reported on 04/07/2023) 32 tablet 0   No current facility-administered medications for this visit.    REVIEW OF SYSTEMS:  [X]  denotes positive finding, [ ]  denotes negative finding Cardiac  Comments:  Chest pain or  chest pressure:    Shortness of breath upon exertion:    Short of breath when lying flat:    Irregular heart rhythm:        Vascular    Pain in calf, thigh, or hip brought on by ambulation: x   Pain in feet at night that wakes you up from your sleep:     Blood clot in your veins:    Leg swelling:         Pulmonary    Oxygen at home:    Productive cough:     Wheezing:         Neurologic    Sudden weakness in arms or legs:     Sudden numbness in arms or legs:     Sudden onset of difficulty speaking or slurred speech:    Temporary loss of vision in one eye:     Problems with dizziness:         Gastrointestinal    Blood in stool:     Vomited blood:         Genitourinary    Burning when urinating:     Blood in urine:        Psychiatric    Major depression:         Hematologic    Bleeding problems:    Problems with blood clotting too easily:        Skin    Rashes or ulcers:        Constitutional    Fever or chills:    -  PHYSICAL EXAM:   Vitals:   04/07/23 0829  BP: (!) 128/91  Pulse: 81  Resp: 18  Temp: 97.9 F (36.6 C)  TempSrc: Temporal  SpO2: 100%  Weight: 243 lb 8 oz (110.5 kg)  Height: 5\' 7"  (1.702 m)   Body mass index is 38.14 kg/m. GENERAL: The patient is a well-nourished female, in no acute distress. The vital signs are documented above. CARDIAC: There is a regular rate and rhythm.  VASCULAR: I do not detect carotid bruits. She has palpable dorsalis pedis and posterior tibial pulses bilaterally. She has multiple areas with telangiectasias in her posterior thigh and lateral thigh.     PULMONARY: There is good air exchange bilaterally without wheezing or rales. ABDOMEN: Soft and non-tender with normal pitched bowel sounds.  MUSCULOSKELETAL: There are no major deformities. NEUROLOGIC: No focal weakness or paresthesias are detected. SKIN: There are no ulcers or rashes noted. PSYCHIATRIC: The patient has a normal affect.  DATA:    She did  not have formal venous reflux testing.  [facility]

## 2023-04-07 NOTE — Telephone Encounter (Signed)
Pt called to ask f/u questions about seeing MD this morning, regarding sclerotherapy. She is not sure how many vials she would want to start with and will discuss with her spouse and call us when it is cooler out to schedule.

## 2023-05-12 ENCOUNTER — Encounter (HOSPITAL_COMMUNITY): Payer: Managed Care, Other (non HMO)

## 2023-07-27 ENCOUNTER — Other Ambulatory Visit (HOSPITAL_BASED_OUTPATIENT_CLINIC_OR_DEPARTMENT_OTHER): Payer: Self-pay

## 2023-07-27 MED ORDER — WEGOVY 1 MG/0.5ML ~~LOC~~ SOAJ
1.0000 mg | SUBCUTANEOUS | 0 refills | Status: AC
  Filled 2023-07-27: qty 2, 28d supply, fill #0

## 2023-08-02 ENCOUNTER — Other Ambulatory Visit: Payer: Self-pay | Admitting: Nurse Practitioner

## 2023-08-02 ENCOUNTER — Other Ambulatory Visit (HOSPITAL_BASED_OUTPATIENT_CLINIC_OR_DEPARTMENT_OTHER): Payer: Self-pay

## 2023-08-02 DIAGNOSIS — Z1231 Encounter for screening mammogram for malignant neoplasm of breast: Secondary | ICD-10-CM

## 2023-08-03 ENCOUNTER — Other Ambulatory Visit (HOSPITAL_BASED_OUTPATIENT_CLINIC_OR_DEPARTMENT_OTHER): Payer: Self-pay

## 2023-08-03 MED ORDER — SHINGRIX 50 MCG/0.5ML IM SUSR
0.5000 mL | Freq: Once | INTRAMUSCULAR | 1 refills | Status: AC
  Filled 2023-08-03: qty 0.5, 1d supply, fill #0
  Filled 2023-10-19: qty 0.5, 1d supply, fill #1

## 2023-08-27 ENCOUNTER — Other Ambulatory Visit (HOSPITAL_BASED_OUTPATIENT_CLINIC_OR_DEPARTMENT_OTHER): Payer: Self-pay

## 2023-08-27 MED ORDER — WEGOVY 1.7 MG/0.75ML ~~LOC~~ SOAJ
1.7000 mg | SUBCUTANEOUS | 0 refills | Status: DC
  Filled 2023-08-27 (×2): qty 3, 28d supply, fill #0

## 2023-08-27 MED ORDER — ERGOCALCIFEROL 1.25 MG (50000 UT) PO CAPS
1.0000 | ORAL_CAPSULE | ORAL | 1 refills | Status: AC
  Filled 2023-08-27 – 2023-09-27 (×3): qty 12, 84d supply, fill #0
  Filled 2024-01-06: qty 12, 84d supply, fill #1

## 2023-08-28 ENCOUNTER — Other Ambulatory Visit (HOSPITAL_BASED_OUTPATIENT_CLINIC_OR_DEPARTMENT_OTHER): Payer: Self-pay

## 2023-09-02 ENCOUNTER — Ambulatory Visit
Admission: RE | Admit: 2023-09-02 | Discharge: 2023-09-02 | Disposition: A | Discharge status: Home / Self Care | Payer: Medicaid Other | Source: Ambulatory Visit | Attending: Nurse Practitioner | Admitting: Nurse Practitioner

## 2023-09-02 DIAGNOSIS — Z1231 Encounter for screening mammogram for malignant neoplasm of breast: Secondary | ICD-10-CM

## 2023-09-07 ENCOUNTER — Other Ambulatory Visit (HOSPITAL_BASED_OUTPATIENT_CLINIC_OR_DEPARTMENT_OTHER): Payer: Self-pay

## 2023-09-27 ENCOUNTER — Other Ambulatory Visit (HOSPITAL_BASED_OUTPATIENT_CLINIC_OR_DEPARTMENT_OTHER): Payer: Self-pay

## 2023-09-27 MED ORDER — WEGOVY 1.7 MG/0.75ML ~~LOC~~ SOAJ
1.7000 mg | SUBCUTANEOUS | 0 refills | Status: DC
  Filled 2023-09-27: qty 3, 28d supply, fill #0

## 2023-10-19 ENCOUNTER — Other Ambulatory Visit (HOSPITAL_BASED_OUTPATIENT_CLINIC_OR_DEPARTMENT_OTHER): Payer: Self-pay

## 2023-11-01 ENCOUNTER — Other Ambulatory Visit: Payer: Self-pay

## 2023-11-01 ENCOUNTER — Other Ambulatory Visit (HOSPITAL_BASED_OUTPATIENT_CLINIC_OR_DEPARTMENT_OTHER): Payer: Self-pay

## 2023-11-01 MED ORDER — WEGOVY 1.7 MG/0.75ML ~~LOC~~ SOAJ
1.7000 mg | SUBCUTANEOUS | 0 refills | Status: AC
  Filled 2023-11-01: qty 3, 28d supply, fill #0

## 2023-11-01 MED ORDER — LEVOCETIRIZINE DIHYDROCHLORIDE 5 MG PO TABS
5.0000 mg | ORAL_TABLET | Freq: Every day | ORAL | 2 refills | Status: AC
  Filled 2023-11-01: qty 30, 30d supply, fill #0
  Filled 2024-01-11: qty 30, 30d supply, fill #1

## 2023-11-04 ENCOUNTER — Other Ambulatory Visit (HOSPITAL_BASED_OUTPATIENT_CLINIC_OR_DEPARTMENT_OTHER): Payer: Self-pay

## 2023-11-25 ENCOUNTER — Other Ambulatory Visit (HOSPITAL_BASED_OUTPATIENT_CLINIC_OR_DEPARTMENT_OTHER): Payer: Self-pay

## 2023-11-25 ENCOUNTER — Other Ambulatory Visit: Payer: Self-pay

## 2023-11-25 MED ORDER — WEGOVY 2.4 MG/0.75ML ~~LOC~~ SOAJ
2.4000 mg | SUBCUTANEOUS | 0 refills | Status: DC
  Filled 2023-11-25: qty 3, 28d supply, fill #0

## 2024-01-06 ENCOUNTER — Other Ambulatory Visit (HOSPITAL_BASED_OUTPATIENT_CLINIC_OR_DEPARTMENT_OTHER): Payer: Self-pay

## 2024-01-06 MED ORDER — AZELASTINE HCL 0.1 % NA SOLN
1.0000 | Freq: Two times a day (BID) | NASAL | 0 refills | Status: AC
  Filled 2024-01-06: qty 30, 34d supply, fill #0

## 2024-01-06 MED ORDER — WEGOVY 2.4 MG/0.75ML ~~LOC~~ SOAJ
2.4000 mg | SUBCUTANEOUS | 0 refills | Status: DC
  Filled 2024-01-06: qty 3, 28d supply, fill #0

## 2024-01-07 ENCOUNTER — Other Ambulatory Visit (HOSPITAL_BASED_OUTPATIENT_CLINIC_OR_DEPARTMENT_OTHER): Payer: Self-pay

## 2024-01-11 ENCOUNTER — Other Ambulatory Visit (HOSPITAL_BASED_OUTPATIENT_CLINIC_OR_DEPARTMENT_OTHER): Payer: Self-pay

## 2024-01-12 ENCOUNTER — Other Ambulatory Visit (HOSPITAL_BASED_OUTPATIENT_CLINIC_OR_DEPARTMENT_OTHER): Payer: Self-pay

## 2024-01-13 ENCOUNTER — Other Ambulatory Visit (HOSPITAL_BASED_OUTPATIENT_CLINIC_OR_DEPARTMENT_OTHER): Payer: Self-pay

## 2024-01-14 ENCOUNTER — Other Ambulatory Visit (HOSPITAL_BASED_OUTPATIENT_CLINIC_OR_DEPARTMENT_OTHER): Payer: Self-pay

## 2024-01-17 ENCOUNTER — Other Ambulatory Visit (HOSPITAL_BASED_OUTPATIENT_CLINIC_OR_DEPARTMENT_OTHER): Payer: Self-pay

## 2024-02-11 ENCOUNTER — Other Ambulatory Visit (HOSPITAL_BASED_OUTPATIENT_CLINIC_OR_DEPARTMENT_OTHER): Payer: Self-pay

## 2024-02-11 MED ORDER — WEGOVY 2.4 MG/0.75ML ~~LOC~~ SOAJ
2.4000 mg | SUBCUTANEOUS | 0 refills | Status: DC
  Filled 2024-02-11: qty 3, 28d supply, fill #0

## 2024-02-11 MED ORDER — AMOXICILLIN-POT CLAVULANATE 500-125 MG PO TABS
1.0000 | ORAL_TABLET | Freq: Two times a day (BID) | ORAL | 0 refills | Status: AC
  Filled 2024-02-11: qty 14, 7d supply, fill #0

## 2024-03-21 ENCOUNTER — Other Ambulatory Visit (HOSPITAL_COMMUNITY): Payer: Self-pay

## 2024-03-21 ENCOUNTER — Other Ambulatory Visit (HOSPITAL_BASED_OUTPATIENT_CLINIC_OR_DEPARTMENT_OTHER): Payer: Self-pay

## 2024-03-21 MED ORDER — VITAMIN D (ERGOCALCIFEROL) 1.25 MG (50000 UNIT) PO CAPS
50000.0000 [IU] | ORAL_CAPSULE | ORAL | 1 refills | Status: AC
  Filled 2024-03-21: qty 12, 84d supply, fill #0

## 2024-03-21 MED ORDER — WEGOVY 2.4 MG/0.75ML ~~LOC~~ SOAJ
2.4000 mg | SUBCUTANEOUS | 0 refills | Status: DC
  Filled 2024-03-21: qty 3, 28d supply, fill #0

## 2024-04-12 ENCOUNTER — Other Ambulatory Visit (HOSPITAL_BASED_OUTPATIENT_CLINIC_OR_DEPARTMENT_OTHER): Payer: Self-pay

## 2024-04-12 MED ORDER — WEGOVY 2.4 MG/0.75ML ~~LOC~~ SOAJ
2.4000 mg | SUBCUTANEOUS | 0 refills | Status: AC
  Filled 2024-04-12 – 2024-04-13 (×3): qty 3, 28d supply, fill #0

## 2024-04-13 ENCOUNTER — Other Ambulatory Visit (HOSPITAL_BASED_OUTPATIENT_CLINIC_OR_DEPARTMENT_OTHER): Payer: Self-pay

## 2024-05-17 ENCOUNTER — Other Ambulatory Visit (HOSPITAL_BASED_OUTPATIENT_CLINIC_OR_DEPARTMENT_OTHER): Payer: Self-pay

## 2024-05-17 MED ORDER — TOPIRAMATE 100 MG PO TABS
100.0000 mg | ORAL_TABLET | Freq: Every day | ORAL | 2 refills | Status: AC
  Filled 2024-05-17: qty 30, 30d supply, fill #0

## 2024-05-17 MED ORDER — PHENTERMINE HCL 30 MG PO CAPS
30.0000 mg | ORAL_CAPSULE | Freq: Every day | ORAL | 0 refills | Status: DC
  Filled 2024-05-17: qty 30, 30d supply, fill #0

## 2024-05-18 ENCOUNTER — Other Ambulatory Visit: Payer: Self-pay

## 2024-06-19 ENCOUNTER — Other Ambulatory Visit (HOSPITAL_BASED_OUTPATIENT_CLINIC_OR_DEPARTMENT_OTHER): Payer: Self-pay

## 2024-06-19 ENCOUNTER — Other Ambulatory Visit: Payer: Self-pay

## 2024-06-19 MED ORDER — PHENTERMINE HCL 30 MG PO CAPS
30.0000 mg | ORAL_CAPSULE | Freq: Every day | ORAL | 0 refills | Status: DC
  Filled 2024-06-19: qty 30, 30d supply, fill #0

## 2024-06-19 MED ORDER — TOPIRAMATE 100 MG PO TABS
100.0000 mg | ORAL_TABLET | Freq: Every day | ORAL | 2 refills | Status: AC
  Filled 2024-06-19: qty 30, 30d supply, fill #0

## 2024-06-19 MED ORDER — VITAMIN D (ERGOCALCIFEROL) 1.25 MG (50000 UNIT) PO CAPS
50000.0000 [IU] | ORAL_CAPSULE | ORAL | 1 refills | Status: AC
  Filled 2024-06-19: qty 12, 90d supply, fill #0
  Filled 2024-08-02: qty 12, 84d supply, fill #0

## 2024-06-27 ENCOUNTER — Other Ambulatory Visit (HOSPITAL_BASED_OUTPATIENT_CLINIC_OR_DEPARTMENT_OTHER): Payer: Self-pay

## 2024-06-27 ENCOUNTER — Other Ambulatory Visit: Payer: Self-pay

## 2024-06-27 MED ORDER — ERYTHROMYCIN 5 MG/GM OP OINT
TOPICAL_OINTMENT | OPHTHALMIC | 0 refills | Status: AC
  Filled 2024-06-27: qty 3.5, 5d supply, fill #0
  Filled 2024-06-27: qty 3.5, 7d supply, fill #0

## 2024-06-30 ENCOUNTER — Other Ambulatory Visit (HOSPITAL_BASED_OUTPATIENT_CLINIC_OR_DEPARTMENT_OTHER): Payer: Self-pay

## 2024-07-17 ENCOUNTER — Other Ambulatory Visit (HOSPITAL_BASED_OUTPATIENT_CLINIC_OR_DEPARTMENT_OTHER): Payer: Self-pay

## 2024-07-17 MED ORDER — PHENTERMINE HCL 30 MG PO CAPS
30.0000 mg | ORAL_CAPSULE | Freq: Every day | ORAL | 0 refills | Status: AC
  Filled 2024-08-02: qty 30, 30d supply, fill #0

## 2024-08-02 ENCOUNTER — Other Ambulatory Visit (HOSPITAL_BASED_OUTPATIENT_CLINIC_OR_DEPARTMENT_OTHER): Payer: Self-pay

## 2024-08-02 MED ORDER — PREDNISONE 20 MG PO TABS
ORAL_TABLET | ORAL | 0 refills | Status: AC
  Filled 2024-08-02: qty 7, 6d supply, fill #0

## 2024-08-02 MED ORDER — OXYMETAZOLINE HCL 0.05 % NA SOLN
2.0000 | Freq: Two times a day (BID) | NASAL | 0 refills | Status: AC | PRN
  Filled 2024-08-02: qty 30, 75d supply, fill #0

## 2024-08-02 MED ORDER — ZINC PO LOZG: LOZENGE | ORAL | 0 refills | Status: AC

## 2024-08-02 MED ORDER — IBUPROFEN 400 MG PO TABS
400.0000 mg | ORAL_TABLET | Freq: Two times a day (BID) | ORAL | 0 refills | Status: AC | PRN
  Filled 2024-08-02: qty 6, 3d supply, fill #0

## 2024-08-14 ENCOUNTER — Other Ambulatory Visit (HOSPITAL_BASED_OUTPATIENT_CLINIC_OR_DEPARTMENT_OTHER): Payer: Self-pay

## 2024-08-14 MED ORDER — OMEPRAZOLE 20 MG PO CPDR
20.0000 mg | DELAYED_RELEASE_CAPSULE | Freq: Every day | ORAL | 2 refills | Status: AC
  Filled 2024-08-14 (×2): qty 30, 30d supply, fill #0

## 2024-08-16 ENCOUNTER — Other Ambulatory Visit (HOSPITAL_COMMUNITY): Payer: Self-pay

## 2024-08-18 ENCOUNTER — Other Ambulatory Visit (HOSPITAL_BASED_OUTPATIENT_CLINIC_OR_DEPARTMENT_OTHER): Payer: Self-pay

## 2024-08-30 ENCOUNTER — Other Ambulatory Visit: Payer: Self-pay | Admitting: Nurse Practitioner

## 2024-08-30 DIAGNOSIS — Z1231 Encounter for screening mammogram for malignant neoplasm of breast: Secondary | ICD-10-CM

## 2024-09-05 ENCOUNTER — Ambulatory Visit

## 2024-09-12 ENCOUNTER — Ambulatory Visit
